# Patient Record
Sex: Male | Born: 1990 | Race: Black or African American | Hispanic: No | Marital: Single | Smoking: Current every day smoker
Health system: Southern US, Community
[De-identification: ages and names within clinical notes are randomized; demographics above are authoritative.]

---
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
## 2010-01-14 ENCOUNTER — Emergency Department (HOSPITAL_COMMUNITY): Admission: EM | Admit: 2010-01-14 | Discharge: 2010-01-14 | Payer: Self-pay | Admitting: Emergency Medicine

## 2011-01-29 ENCOUNTER — Emergency Department (HOSPITAL_COMMUNITY)
Admission: EM | Admit: 2011-01-29 | Discharge: 2011-01-29 | Payer: PRIVATE HEALTH INSURANCE | Attending: Emergency Medicine | Admitting: Emergency Medicine

## 2011-01-29 ENCOUNTER — Encounter: Payer: Self-pay | Admitting: *Deleted

## 2011-01-29 ENCOUNTER — Emergency Department (HOSPITAL_COMMUNITY): Payer: PRIVATE HEALTH INSURANCE

## 2011-01-29 DIAGNOSIS — S0180XA Unspecified open wound of other part of head, initial encounter: Secondary | ICD-10-CM | POA: Insufficient documentation

## 2011-01-29 DIAGNOSIS — IMO0002 Reserved for concepts with insufficient information to code with codable children: Secondary | ICD-10-CM | POA: Insufficient documentation

## 2011-01-29 DIAGNOSIS — S0181XA Laceration without foreign body of other part of head, initial encounter: Secondary | ICD-10-CM

## 2011-01-29 MED ORDER — TETANUS-DIPHTH-ACELL PERTUSSIS 5-2.5-18.5 LF-MCG/0.5 IM SUSP
0.5000 mL | Freq: Once | INTRAMUSCULAR | Status: AC
  Administered 2011-01-29: 0.5 mL via INTRAMUSCULAR
  Filled 2011-01-29: qty 0.5

## 2011-01-29 MED ORDER — IBUPROFEN 800 MG PO TABS
800.0000 mg | ORAL_TABLET | Freq: Once | ORAL | Status: AC
  Administered 2011-01-29: 800 mg via ORAL
  Filled 2011-01-29: qty 1

## 2011-01-29 NOTE — ED Provider Notes (Signed)
Medical screening examination/treatment/procedure(s) were performed by non-physician practitioner and as supervising physician I was immediately available for consultation/collaboration.  Brendon Christoffel, MD 01/29/11 1949 
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
## 2011-01-31 NOTE — ED Provider Notes (Signed)
Medical screening examination/treatment/procedure(s) were performed by non-physician practitioner and as supervising physician I was immediately available for consultation/collaboration.  Donnetta Hutching, MD 01/31/11 [phone]

## 2011-02-06 ENCOUNTER — Encounter (HOSPITAL_COMMUNITY): Payer: Self-pay | Admitting: *Deleted

## 2011-02-06 ENCOUNTER — Emergency Department (HOSPITAL_COMMUNITY)
Admission: EM | Admit: 2011-02-06 | Discharge: 2011-02-06 | Disposition: A | Discharge status: Home / Self Care | Payer: PRIVATE HEALTH INSURANCE | Attending: Emergency Medicine | Admitting: Emergency Medicine

## 2011-02-06 DIAGNOSIS — Z4802 Encounter for removal of sutures: Secondary | ICD-10-CM | POA: Insufficient documentation

## 2011-02-06 NOTE — ED Provider Notes (Signed)
Medical screening examination/treatment/procedure(s) were performed by non-physician practitioner and as supervising physician I was immediately available for consultation/collaboration.   Shelda Jakes, MD 02/06/11 [phone]

## 2011-02-06 NOTE — ED Notes (Signed)
Sutures removed without difficulty after R. Miller  PA-C examined sutured site. Site remains free  Of bleeding, drainage at this time. Pt tolerated well.
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
## 2011-02-06 NOTE — ED Notes (Signed)
Pt here secondary to suture removal need.  Laceration to left forehead is noted to be clean with edges well approximated.  No s/s of infection or drainage noted.  Pt denies fever or pain in that area.

## 2013-10-18 ENCOUNTER — Encounter (HOSPITAL_COMMUNITY): Payer: Self-pay | Admitting: Emergency Medicine

## 2013-10-18 ENCOUNTER — Emergency Department (HOSPITAL_COMMUNITY)
Admission: EM | Admit: 2013-10-18 | Discharge: 2013-10-18 | Disposition: A | Discharge status: Home / Self Care | Payer: BC Managed Care – PPO | Attending: Emergency Medicine | Admitting: Emergency Medicine

## 2013-10-18 ENCOUNTER — Emergency Department (HOSPITAL_COMMUNITY): Payer: BC Managed Care – PPO

## 2013-10-18 DIAGNOSIS — R0602 Shortness of breath: Secondary | ICD-10-CM | POA: Insufficient documentation

## 2013-10-18 DIAGNOSIS — S298XXA Other specified injuries of thorax, initial encounter: Secondary | ICD-10-CM | POA: Insufficient documentation

## 2013-10-18 DIAGNOSIS — T148XXA Other injury of unspecified body region, initial encounter: Secondary | ICD-10-CM

## 2013-10-18 DIAGNOSIS — Y9301 Activity, walking, marching and hiking: Secondary | ICD-10-CM | POA: Insufficient documentation

## 2013-10-18 DIAGNOSIS — F17200 Nicotine dependence, unspecified, uncomplicated: Secondary | ICD-10-CM | POA: Insufficient documentation

## 2013-10-18 DIAGNOSIS — Y92410 Unspecified street and highway as the place of occurrence of the external cause: Secondary | ICD-10-CM | POA: Insufficient documentation

## 2013-10-18 LAB — CBC WITH DIFFERENTIAL/PLATELET
BASOS ABS: 0 10*3/uL (ref 0.0–0.1)
BASOS PCT: 0 % (ref 0–1)
EOS PCT: 2 % (ref 0–5)
Eosinophils Absolute: 0.1 10*3/uL (ref 0.0–0.7)
HCT: 43 % (ref 39.0–52.0)
Hemoglobin: 15.3 g/dL (ref 13.0–17.0)
LYMPHS PCT: 29 % (ref 12–46)
Lymphs Abs: 2 10*3/uL (ref 0.7–4.0)
MCH: 30.5 pg (ref 26.0–34.0)
MCHC: 35.6 g/dL (ref 30.0–36.0)
MCV: 85.8 fL (ref 78.0–100.0)
Monocytes Absolute: 0.4 10*3/uL (ref 0.1–1.0)
Monocytes Relative: 7 % (ref 3–12)
NEUTROS ABS: 4.2 10*3/uL (ref 1.7–7.7)
Neutrophils Relative %: 62 % (ref 43–77)
PLATELETS: 208 10*3/uL (ref 150–400)
RBC: 5.01 MIL/uL (ref 4.22–5.81)
RDW: 13.3 % (ref 11.5–15.5)
WBC: 6.8 10*3/uL (ref 4.0–10.5)

## 2013-10-18 LAB — COMPREHENSIVE METABOLIC PANEL
ALBUMIN: 4.3 g/dL (ref 3.5–5.2)
ALT: 18 U/L (ref 0–53)
AST: 20 U/L (ref 0–37)
Alkaline Phosphatase: 65 U/L (ref 39–117)
BUN: 12 mg/dL (ref 6–23)
CALCIUM: 9.7 mg/dL (ref 8.4–10.5)
CO2: 23 meq/L (ref 19–32)
Chloride: 102 mEq/L (ref 96–112)
Creatinine, Ser: 1.04 mg/dL (ref 0.50–1.35)
GFR calc Af Amer: >90 mL/min (ref >90)
Glucose, Bld: 101 mg/dL — ABNORMAL HIGH (ref 70–99)
Potassium: 3.9 mEq/L (ref 3.7–5.3)
SODIUM: 138 meq/L (ref 137–147)
Total Bilirubin: 0.5 mg/dL (ref 0.3–1.2)
Total Protein: 7.4 g/dL (ref 6.0–8.3)

## 2013-10-18 MED ORDER — TRAMADOL HCL 50 MG PO TABS: 50.0000 mg | ORAL_TABLET | Freq: Four times a day (QID) | ORAL | Status: DC | PRN

## 2013-10-18 MED ORDER — SODIUM CHLORIDE 0.9 % IV SOLN: INTRAVENOUS | Status: DC

## 2013-10-18 MED ORDER — IOHEXOL 300 MG/ML  SOLN
100.0000 mL | Freq: Once | INTRAMUSCULAR | Status: AC | PRN
  Administered 2013-10-18: 100 mL via INTRAVENOUS
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
## 2022-03-30 ENCOUNTER — Ambulatory Visit (HOSPITAL_COMMUNITY)
Admission: EM | Admit: 2022-03-30 | Discharge: 2022-03-30 | Disposition: A | Discharge status: Home / Self Care | Payer: No Payment, Other | Attending: Psychiatry | Admitting: Psychiatry

## 2022-03-30 DIAGNOSIS — F1290 Cannabis use, unspecified, uncomplicated: Secondary | ICD-10-CM | POA: Insufficient documentation

## 2022-03-30 DIAGNOSIS — F1410 Cocaine abuse, uncomplicated: Secondary | ICD-10-CM | POA: Insufficient documentation

## 2022-03-30 NOTE — Social Work (Signed)
Hey patient is ready, patient denies HI SI Patients admits to cocaine use in the last 24 hours. Patient states he is just here for probation. Patient also states that he is going to [place]. patient denies any mental health issues.

## 2022-03-30 NOTE — ED Provider Notes (Cosign Needed Addendum)
Behavioral Health Urgent Care Medical Screening Exam  Patient Name: Miguel Lucas MRN: [id number] Date of Evaluation: 03/30/22 Chief Complaint:   Diagnosis:  Final diagnoses:  Cocaine use disorder ([place])  Marijuana use    History of Present illness: Miguel Lucas is a 31 y.o. male. Patient presents voluntarily to [facility] behavioral health for walk-in assessment.  Patient is accompanied by his girlfriend, who does not remain present during assessment.  Patient is assessed, face-to-face, by nurse practitioner, seated in assessment area, no acute distress.  He is alert and oriented, cooperative during assessment.   Patient  presents with euthymic mood, congruent affect. He  denies suicidal and homicidal ideations. Denies history of suicide attempts, denies history of self-harm.  Patient easily  contracts verbally for safety with this Probation officer.  Patient states "I am here because I have to do this to finish my parole, I have not passed enough drug tests." Patient states "my probation officer dropped me off here, he has arranged for me to complete a 28 day program at [facility] in [facility]." He is committed to his sobriety and recovery today, agrees with plan to seek residential substance use treatment.   Miguel Lucas states "I am not addicted, I just use drugs, I use drugs because I like to." He endorses daily use of  cocaine use and marijuana. Most recent marijuana use 2 days ago, most recent cocaine use yesterday. He denies alcohol use, denies substance use aside from cocaine and marijuana. He has not sought substance use treatment in the past.    Patient has normal speech and behavior.  He  denies auditory and visual hallucinations.  Patient is able to converse coherently with goal-directed thoughts and no distractibility or preoccupation.  Denies symptoms of paranoia.  Objectively there is no evidence of psychosis/mania or delusional thinking.  Patient is not linked with outpatient  psychiatry. No current medications. He denies history of inpatient psychiatric admission. Denies family mental health history, denies family history of addiction.   Miguel Lucas resides in [place] with his girlfriend. He is primary caregiver for his daughter. He denies access to weapons. He is employed.  Patient endorses average sleep and appetite.  Patient offered support and encouragement. Patient offered Facility Based Crisis unit admission to await residential substance use treatment, declined. Prefers to return home and continue to work while awaiting residential substance use treatment availability.     Psychiatric Specialty Exam  Presentation  General Appearance:Appropriate for Environment; Casual  Eye Contact:Good  Speech:Clear and Coherent; Normal Rate  Speech Volume:Normal  Handedness:Right   Mood and Affect  Mood: Euthymic  Affect: Appropriate; Congruent   Thought Process  Thought Processes: Coherent; Goal Directed; Linear  Descriptions of Associations:No data recorded Orientation:Full (Time, Place and Person)  Thought Content:Logical; WDL    Hallucinations:None  Ideas of Reference:None  Suicidal Thoughts:No  Homicidal Thoughts:No   Sensorium  Memory: Immediate Good; Recent Good  Judgment: Fair  Insight: Fair   Community education officer  Concentration: Good  Attention Span: Good  Recall: Good  Fund of Knowledge: Good  Language: Good   Psychomotor Activity  Psychomotor Activity: Normal   Assets  Assets: Communication Skills; Housing; Intimacy; Leisure Time; Resilience; Physical Health; Social Support   Sleep  Sleep: Good  Number of hours: No data recorded  No data recorded  Physical Exam: Physical Exam Vitals and nursing note reviewed.  Constitutional:      Appearance: Normal appearance. He is well-developed and normal weight.  HENT:  Head: Normocephalic and atraumatic.  Cardiovascular:     Rate and Rhythm:  Normal rate.  Pulmonary:     Effort: Pulmonary effort is normal.  Musculoskeletal:        General: Normal range of motion.  Skin:    General: Skin is warm and dry.  Neurological:     Mental Status: He is alert and oriented to person, place, and time.  Psychiatric:        Attention and Perception: Attention and perception normal.        Mood and Affect: Mood and affect normal.        Speech: Speech normal.        Behavior: Behavior normal. Behavior is cooperative.        Thought Content: Thought content normal.        Cognition and Memory: Cognition and memory normal.        Judgment: Judgment normal.    Review of Systems  Constitutional: Negative.   HENT: Negative.    Eyes: Negative.   Respiratory: Negative.    Cardiovascular: Negative.   Gastrointestinal: Negative.   Genitourinary: Negative.   Musculoskeletal: Negative.   Skin: Negative.   Neurological: Negative.   Psychiatric/Behavioral:  Positive for substance abuse.    Blood pressure (!) 163/90, pulse 80, temperature 98.3 F (36.8 C), temperature source Oral, resp. rate 18, SpO2 98 %. There is no height or weight on file to calculate BMI.  Musculoskeletal: Strength & Muscle Tone: within normal limits Gait & Station: normal Patient leans: N/A   [facility] MSE Discharge Disposition for Follow up and Recommendations: Based on my evaluation the patient does not appear to have an emergency medical condition and can be discharged with resources and follow up care in outpatient services for Medication Management and Individual Therapy Follow up with outpatient mental health, resources provided. Follow up with substance use treatment resources provided.    Lenard Lance, FNP 03/30/2022, 10:04 AM

## 2022-03-30 NOTE — Discharge Instructions (Addendum)
SUBSTANCE USE TREATMENT for Medicaid and State Funded/IPRS  [facility] [address], [place], [address] [phone] phone NOTE: [facility] is no longer offering IOP services.  Serves those who are low-income or have no insurance.  [facility] [address], [place], [place], [address] [phone] phone [phone] fax NOTE: Does have Substance Abuse-Intensive Outpatient Program [facility]) as well as transitional housing if eligible.  [facility] [address] [place], [place], [address] [phone] phone [phone] fax  [facility] [phone] [address] [place], [place], [address] [phone] phone [phone] fax  Patient is instructed prior to discharge to:  Take all medications as prescribed by his/her mental healthcare provider. Report any adverse effects and or reactions from the medicines to his/her outpatient provider promptly. Keep all scheduled appointments, to ensure that you are getting refills on time and to avoid any interruption in your medication.  If you are unable to keep an appointment call to reschedule.  Be sure to follow-up with resources and follow-up appointments provided.  Patient has been instructed & cautioned: To not engage in alcohol and or illegal drug use while on prescription medicines. In the event of worsening symptoms, patient is instructed to call the crisis hotline, 911 and or go to the nearest ED for appropriate evaluation and treatment of symptoms. To follow-up with his/her primary care provider for your other medical issues, concerns and or health care needs.  Information: -National Suicide Prevention Lifeline 1-800-SUICIDE or 4785394982.  -988 offers 24/7 access to trained crisis counselors who can help people experiencing mental health-related distress. People can call or text 988 or chat 988lifeline.org for themselves or if they are worried about a loved one who may need crisis support.

## 2022-03-30 NOTE — BH Assessment (Signed)
LCSW Progress Note   Per Doran Heater, NP, this pt does not require psychiatric hospitalization at this time.  Pt is psychiatrically cleared.  Discharge instructions include several resources for substance use treatment for those without insurance.  EDP Doran Heater, NP, has been notified.  Hansel Starling, MSW, LCSW [facility] [phone] or [phone]

## 2022-09-17 ENCOUNTER — Encounter (HOSPITAL_COMMUNITY): Payer: Self-pay

## 2022-09-17 ENCOUNTER — Other Ambulatory Visit: Payer: Self-pay

## 2022-09-17 ENCOUNTER — Emergency Department (HOSPITAL_COMMUNITY)
Admission: EM | Admit: 2022-09-17 | Discharge: 2022-09-17 | Disposition: A | Discharge status: Home / Self Care | Payer: Medicaid Other | Attending: Emergency Medicine | Admitting: Emergency Medicine

## 2022-09-17 DIAGNOSIS — N369 Urethral disorder, unspecified: Secondary | ICD-10-CM | POA: Diagnosis present

## 2022-09-17 DIAGNOSIS — Z113 Encounter for screening for infections with a predominantly sexual mode of transmission: Secondary | ICD-10-CM | POA: Diagnosis not present

## 2022-09-17 LAB — URINALYSIS, ROUTINE W REFLEX MICROSCOPIC
Bilirubin Urine: NEGATIVE
Glucose, UA: NEGATIVE mg/dL
Hgb urine dipstick: NEGATIVE
Ketones, ur: NEGATIVE mg/dL
Leukocytes,Ua: NEGATIVE
Nitrite: NEGATIVE
Protein, ur: NEGATIVE mg/dL
Specific Gravity, Urine: 1.002 — ABNORMAL LOW (ref 1.005–1.030)
pH: 7 (ref 5.0–8.0)

## 2022-09-17 NOTE — ED Triage Notes (Signed)
Pt was first seen on last month. Was given antibiotics. Has completed the course of Abx, but still has issues with itching and minor pain.

## 2022-09-17 NOTE — ED Notes (Signed)
Pt provided discharge instructions and prescription information. Pt was given the opportunity to ask questions and questions were answered.   

## 2022-09-17 NOTE — Discharge Instructions (Signed)
You are seen today for screening for STI.  Follow-up your results on MyChart since you declined the empiric treatment.  Follow-up with the health department for complete STI screening, the results are positive make sure you either come back here or ordered health department for treatment.

## 2022-09-17 NOTE — ED Provider Notes (Signed)
[place] EMERGENCY DEPARTMENT AT [facility] Provider Note   CSN: [id number] Arrival date & time: 09/17/22  1132     History  No chief complaint on file.   Miguel Lucas is a 32 y.o. male.  He denies any chronic PMH.  Presents to the ER for evaluation today of a tingling feeling of his urethra.  States it is there whether or not he is urinating.  He was treated last month at health department for gonorrhea because of its positive test.  He reports that he completed his entire treatment, his partner was fully treated and they waited till treatment was complete to resume sexual activity.  He denies any penile discharge, denies any genital rash or lesions.  Denies any fevers or chills or other complaints.  He states he just wants to make sure that the gonorrhea has been cured  HPI     Home Medications Prior to Admission medications   Medication Sig Start Date End Date Taking? Authorizing Provider  traMADol (ULTRAM) 50 MG tablet Take 1 tablet (50 mg total) by mouth every 6 (six) hours as needed. 10/18/13   Gilda Crease, MD      Allergies    Patient has no known allergies.    Review of Systems   Review of Systems  Physical Exam Updated Vital Signs BP (!) 162/91 (BP Location: Right Arm)   Pulse 71   Temp 98.6 F (37 C)   Resp 17   Ht 5\' 6"  (1.676 m)   Wt 79.4 kg   SpO2 100%   BMI 28.25 kg/m  Physical Exam Vitals and nursing note reviewed. Exam conducted with a chaperone present.  Constitutional:      General: He is not in acute distress.    Appearance: He is well-developed.  HENT:     Head: Normocephalic and atraumatic.  Eyes:     Conjunctiva/sclera: Conjunctivae normal.  Cardiovascular:     Rate and Rhythm: Normal rate and regular rhythm.     Heart sounds: No murmur heard. Pulmonary:     Effort: Pulmonary effort is normal. No respiratory distress.     Breath sounds: Normal breath sounds.  Abdominal:     Palpations: Abdomen is soft.      Tenderness: There is no abdominal tenderness.  Genitourinary:    Penis: Normal.   Musculoskeletal:        General: No swelling.     Cervical back: Neck supple.  Skin:    General: Skin is warm and dry.     Capillary Refill: Capillary refill takes less than 2 seconds.  Neurological:     General: No focal deficit present.     Mental Status: He is alert and oriented to person, place, and time.  Psychiatric:        Mood and Affect: Mood normal.     ED Results / Procedures / Treatments   Labs (all labs ordered are listed, but only abnormal results are displayed) Labs Reviewed  URINALYSIS, ROUTINE W REFLEX MICROSCOPIC  GC/CHLAMYDIA PROBE AMP ([place]) NOT AT [facility]    EKG None  Radiology No results found.  Procedures Procedures    Medications Ordered in ED Medications - No data to display  ED Course/ Medical Decision Making/ A&P                             Medical Decision Making DDx: Gonorrhea, chlamydia, HSV, UTI, other\ Course:  Patient presented following repeat STI testing to ensure that he has had care, as well as anything as a tingling sensation of the urethra, no dysuria frequency urgency.  No fevers or chills.  He is requesting by urine be sent and he be discharged as he needs to leave for work.  Does not want empiric treatment, discussed that he will need to follow-up with his doctor on MyChart and he states he is able to do this.           Final Clinical Impression(s) / ED Diagnoses Final diagnoses:  Encounter for screening examination for sexually transmitted infection    Rx / DC Orders ED Discharge Orders     None         Ma Rings, PA-C 09/17/22 1243    Cathren Laine, MD 09/18/22 [phone]

## 2022-09-19 LAB — GC/CHLAMYDIA PROBE AMP (~~LOC~~) NOT AT ARMC
Chlamydia: NEGATIVE
Comment: NEGATIVE
Comment: NORMAL
Neisseria Gonorrhea: POSITIVE — AB

## 2022-11-04 ENCOUNTER — Other Ambulatory Visit: Payer: Self-pay

## 2022-11-04 ENCOUNTER — Encounter (HOSPITAL_COMMUNITY)
Admission: EM | Disposition: A | Discharge status: Home / Self Care | Payer: Self-pay | Source: Home / Self Care | Attending: Cardiology

## 2022-11-04 ENCOUNTER — Encounter (HOSPITAL_COMMUNITY): Payer: Self-pay

## 2022-11-04 ENCOUNTER — Other Ambulatory Visit (HOSPITAL_COMMUNITY): Payer: Self-pay

## 2022-11-04 ENCOUNTER — Inpatient Hospital Stay (HOSPITAL_COMMUNITY)
Admission: EM | Admit: 2022-11-04 | Discharge: 2022-11-05 | DRG: 251 | Disposition: A | Discharge status: Home / Self Care | Payer: Medicaid Other | Attending: Cardiology | Admitting: Cardiology

## 2022-11-04 ENCOUNTER — Inpatient Hospital Stay (HOSPITAL_COMMUNITY): Payer: Medicaid Other

## 2022-11-04 ENCOUNTER — Emergency Department (HOSPITAL_COMMUNITY): Payer: Medicaid Other

## 2022-11-04 DIAGNOSIS — Z8249 Family history of ischemic heart disease and other diseases of the circulatory system: Secondary | ICD-10-CM

## 2022-11-04 DIAGNOSIS — F1410 Cocaine abuse, uncomplicated: Secondary | ICD-10-CM | POA: Diagnosis present

## 2022-11-04 DIAGNOSIS — I2121 ST elevation (STEMI) myocardial infarction involving left circumflex coronary artery: Secondary | ICD-10-CM | POA: Diagnosis not present

## 2022-11-04 DIAGNOSIS — I2510 Atherosclerotic heart disease of native coronary artery without angina pectoris: Secondary | ICD-10-CM | POA: Diagnosis present

## 2022-11-04 DIAGNOSIS — I10 Essential (primary) hypertension: Secondary | ICD-10-CM | POA: Diagnosis present

## 2022-11-04 DIAGNOSIS — Z79899 Other long term (current) drug therapy: Secondary | ICD-10-CM

## 2022-11-04 DIAGNOSIS — I213 ST elevation (STEMI) myocardial infarction of unspecified site: Principal | ICD-10-CM

## 2022-11-04 DIAGNOSIS — F17210 Nicotine dependence, cigarettes, uncomplicated: Secondary | ICD-10-CM | POA: Diagnosis present

## 2022-11-04 DIAGNOSIS — R079 Chest pain, unspecified: Secondary | ICD-10-CM | POA: Diagnosis present

## 2022-11-04 HISTORY — PX: CORONARY/GRAFT ACUTE MI REVASCULARIZATION: CATH118305

## 2022-11-04 LAB — I-STAT CHEM 8, ED
BUN: 7 mg/dL (ref 6–20)
Calcium, Ion: 1.21 mmol/L (ref 1.15–1.40)
Chloride: 100 mmol/L (ref 98–111)
Creatinine, Ser: 1.1 mg/dL (ref 0.61–1.24)
Glucose, Bld: 165 mg/dL — ABNORMAL HIGH (ref 70–99)
HCT: 52 % (ref 39.0–52.0)
Hemoglobin: 17.7 g/dL — ABNORMAL HIGH (ref 13.0–17.0)
Potassium: 4 mmol/L (ref 3.5–5.1)
Sodium: 139 mmol/L (ref 135–145)
TCO2: 27 mmol/L (ref 22–32)

## 2022-11-04 LAB — COMPREHENSIVE METABOLIC PANEL
ALT: 14 U/L (ref 0–44)
AST: 18 U/L (ref 15–41)
Albumin: 4.1 g/dL (ref 3.5–5.0)
Alkaline Phosphatase: 62 U/L (ref 38–126)
Anion gap: 15 (ref 5–15)
BUN: 8 mg/dL (ref 6–20)
CO2: 23 mmol/L (ref 22–32)
Calcium: 9.5 mg/dL (ref 8.9–10.3)
Chloride: 99 mmol/L (ref 98–111)
Creatinine, Ser: 1.14 mg/dL (ref 0.61–1.24)
GFR, Estimated: >60 mL/min (ref >60)
Glucose, Bld: 176 mg/dL — ABNORMAL HIGH (ref 70–99)
Potassium: 4.1 mmol/L (ref 3.5–5.1)
Sodium: 137 mmol/L (ref 135–145)
Total Bilirubin: 0.4 mg/dL (ref 0.3–1.2)
Total Protein: 7.2 g/dL (ref 6.5–8.1)

## 2022-11-04 LAB — CBC WITH DIFFERENTIAL/PLATELET
Abs Immature Granulocytes: 0.04 10*3/uL (ref 0.00–0.07)
Basophils Absolute: 0.1 10*3/uL (ref 0.0–0.1)
Basophils Relative: 1 %
Eosinophils Absolute: 0.3 10*3/uL (ref 0.0–0.5)
Eosinophils Relative: 3 %
HCT: 49.2 % (ref 39.0–52.0)
Hemoglobin: 16.6 g/dL (ref 13.0–17.0)
Immature Granulocytes: 0 %
Lymphocytes Relative: 34 %
Lymphs Abs: 3.5 10*3/uL (ref 0.7–4.0)
MCH: 30 pg (ref 26.0–34.0)
MCHC: 33.7 g/dL (ref 30.0–36.0)
MCV: 88.8 fL (ref 80.0–100.0)
Monocytes Absolute: 0.5 10*3/uL (ref 0.1–1.0)
Monocytes Relative: 5 %
Neutro Abs: 5.9 10*3/uL (ref 1.7–7.7)
Neutrophils Relative %: 57 %
Platelets: 267 10*3/uL (ref 150–400)
RBC: 5.54 MIL/uL (ref 4.22–5.81)
RDW: 13.7 % (ref 11.5–15.5)
WBC: 10.4 10*3/uL (ref 4.0–10.5)
nRBC: 0 % (ref 0.0–0.2)

## 2022-11-04 LAB — LIPID PANEL
Cholesterol: 154 mg/dL (ref 0–200)
HDL: 37 mg/dL — ABNORMAL LOW (ref >40)
LDL Cholesterol: 102 mg/dL — ABNORMAL HIGH (ref 0–99)
Total CHOL/HDL Ratio: 4.2 RATIO
Triglycerides: 73 mg/dL (ref <150)
VLDL: 15 mg/dL (ref 0–40)

## 2022-11-04 LAB — HEMOGLOBIN A1C
Hgb A1c MFr Bld: 4.9 % (ref 4.8–5.6)
Mean Plasma Glucose: 93.93 mg/dL

## 2022-11-04 LAB — TROPONIN I (HIGH SENSITIVITY)
Troponin I (High Sensitivity): 11 ng/L (ref <18)
Troponin I (High Sensitivity): >24000 ng/L (ref <18)

## 2022-11-04 LAB — APTT: aPTT: 24 seconds (ref 24–36)

## 2022-11-04 LAB — MRSA NEXT GEN BY PCR, NASAL: MRSA by PCR Next Gen: NOT DETECTED

## 2022-11-04 LAB — POCT ACTIVATED CLOTTING TIME: Activated Clotting Time: 489 seconds

## 2022-11-04 LAB — GLUCOSE, CAPILLARY: Glucose-Capillary: 113 mg/dL — ABNORMAL HIGH (ref 70–99)

## 2022-11-04 LAB — PROTIME-INR
INR: 0.9 (ref 0.8–1.2)
Prothrombin Time: 12.2 seconds (ref 11.4–15.2)

## 2022-11-04 SURGERY — CORONARY/GRAFT ACUTE MI REVASCULARIZATION
Anesthesia: LOCAL

## 2022-11-04 MED ORDER — FENTANYL CITRATE (PF) 100 MCG/2ML IJ SOLN
INTRAMUSCULAR | Status: AC
  Filled 2022-11-04: qty 2

## 2022-11-04 MED ORDER — TICAGRELOR 90 MG PO TABS
ORAL_TABLET | ORAL | Status: DC | PRN
  Administered 2022-11-04: 180 mg via ORAL

## 2022-11-04 MED ORDER — ACETAMINOPHEN 325 MG PO TABS: 650.0000 mg | ORAL_TABLET | ORAL | Status: DC | PRN

## 2022-11-04 MED ORDER — LABETALOL HCL 5 MG/ML IV SOLN: 10.0000 mg | INTRAVENOUS | Status: AC | PRN

## 2022-11-04 MED ORDER — HYDRALAZINE HCL 20 MG/ML IJ SOLN: 10.0000 mg | INTRAMUSCULAR | Status: AC | PRN

## 2022-11-04 MED ORDER — CLOPIDOGREL BISULFATE 75 MG PO TABS
75.0000 mg | ORAL_TABLET | Freq: Every day | ORAL | Status: DC
  Administered 2022-11-05: 75 mg via ORAL
  Filled 2022-11-04: qty 1

## 2022-11-04 MED ORDER — SODIUM CHLORIDE 0.9% FLUSH
3.0000 mL | Freq: Two times a day (BID) | INTRAVENOUS | Status: DC
  Administered 2022-11-04 (×2): 3 mL via INTRAVENOUS

## 2022-11-04 MED ORDER — NITROGLYCERIN 1 MG/10 ML FOR IR/CATH LAB
INTRA_ARTERIAL | Status: AC
  Filled 2022-11-04: qty 10

## 2022-11-04 MED ORDER — MIDAZOLAM HCL 2 MG/2ML IJ SOLN
INTRAMUSCULAR | Status: AC
  Filled 2022-11-04: qty 2

## 2022-11-04 MED ORDER — TICAGRELOR 90 MG PO TABS: 90.0000 mg | ORAL_TABLET | Freq: Two times a day (BID) | ORAL | Status: DC

## 2022-11-04 MED ORDER — LIDOCAINE HCL (PF) 1 % IJ SOLN
INTRAMUSCULAR | Status: DC | PRN
  Administered 2022-11-04: 2 mL via INTRADERMAL

## 2022-11-04 MED ORDER — NITROGLYCERIN 1 MG/10 ML FOR IR/CATH LAB
INTRA_ARTERIAL | Status: DC | PRN
  Administered 2022-11-04 (×3): 200 ug via INTRACORONARY

## 2022-11-04 MED ORDER — SODIUM CHLORIDE 0.9 % IV SOLN: INTRAVENOUS | Status: DC

## 2022-11-04 MED ORDER — SODIUM CHLORIDE 0.9 % IV SOLN: 250.0000 mL | INTRAVENOUS | Status: DC | PRN

## 2022-11-04 MED ORDER — VERAPAMIL HCL 2.5 MG/ML IV SOLN
INTRAVENOUS | Status: AC
  Filled 2022-11-04: qty 2

## 2022-11-04 MED ORDER — HEPARIN (PORCINE) IN NACL 1000-0.9 UT/500ML-% IV SOLN
INTRAVENOUS | Status: DC | PRN
  Administered 2022-11-04 (×2): 500 mL

## 2022-11-04 MED ORDER — TIROFIBAN HCL IN NACL 5-0.9 MG/100ML-% IV SOLN
INTRAVENOUS | Status: AC
  Filled 2022-11-04: qty 100

## 2022-11-04 MED ORDER — AMLODIPINE BESYLATE 5 MG PO TABS
5.0000 mg | ORAL_TABLET | Freq: Every day | ORAL | 1 refills | Status: DC
  Filled 2022-11-04: qty 30, 30d supply, fill #0

## 2022-11-04 MED ORDER — FENTANYL CITRATE (PF) 100 MCG/2ML IJ SOLN
INTRAMUSCULAR | Status: DC | PRN
  Administered 2022-11-04 (×2): 50 ug via INTRAVENOUS

## 2022-11-04 MED ORDER — ISOSORBIDE MONONITRATE ER 30 MG PO TB24
60.0000 mg | ORAL_TABLET | Freq: Every day | ORAL | Status: DC
  Administered 2022-11-04 – 2022-11-05 (×2): 60 mg via ORAL
  Filled 2022-11-04 (×2): qty 2

## 2022-11-04 MED ORDER — NITROGLYCERIN IN D5W 200-5 MCG/ML-% IV SOLN
INTRAVENOUS | Status: AC | PRN
  Administered 2022-11-04: 20 ug/min via INTRAVENOUS

## 2022-11-04 MED ORDER — TIROFIBAN HCL IN NACL 5-0.9 MG/100ML-% IV SOLN
INTRAVENOUS | Status: DC | PRN
  Administered 2022-11-04: .15 ug/kg/min via INTRAVENOUS

## 2022-11-04 MED ORDER — NITROGLYCERIN IN D5W 200-5 MCG/ML-% IV SOLN
0.0000 ug/min | INTRAVENOUS | Status: DC
  Administered 2022-11-04: 30 ug/min via INTRAVENOUS

## 2022-11-04 MED ORDER — ASPIRIN 81 MG PO TBEC
81.0000 mg | DELAYED_RELEASE_TABLET | Freq: Every day | ORAL | Status: DC
  Administered 2022-11-05: 81 mg via ORAL
  Filled 2022-11-04: qty 1

## 2022-11-04 MED ORDER — SODIUM CHLORIDE 0.9% FLUSH: 3.0000 mL | INTRAVENOUS | Status: DC | PRN

## 2022-11-04 MED ORDER — ASPIRIN 81 MG PO CHEW
324.0000 mg | CHEWABLE_TABLET | Freq: Once | ORAL | Status: AC
  Administered 2022-11-04: 324 mg via ORAL
  Filled 2022-11-04: qty 4

## 2022-11-04 MED ORDER — MELATONIN 3 MG PO TABS
3.0000 mg | ORAL_TABLET | Freq: Every day | ORAL | Status: DC
  Administered 2022-11-04: 3 mg via ORAL
  Filled 2022-11-04: qty 1

## 2022-11-04 MED ORDER — LISINOPRIL 20 MG PO TABS
20.0000 mg | ORAL_TABLET | Freq: Every evening | ORAL | Status: DC
  Administered 2022-11-04: 20 mg via ORAL
  Filled 2022-11-04: qty 1

## 2022-11-04 MED ORDER — NITROGLYCERIN IN D5W 200-5 MCG/ML-% IV SOLN
INTRAVENOUS | Status: AC
  Filled 2022-11-04: qty 250

## 2022-11-04 MED ORDER — ROSUVASTATIN CALCIUM 20 MG PO TABS
20.0000 mg | ORAL_TABLET | Freq: Every day | ORAL | Status: DC
  Administered 2022-11-04 – 2022-11-05 (×2): 20 mg via ORAL
  Filled 2022-11-04 (×2): qty 1

## 2022-11-04 MED ORDER — SODIUM CHLORIDE 0.9 % IV SOLN
INTRAVENOUS | Status: AC | PRN
  Administered 2022-11-04: 10 mL/h via INTRAVENOUS

## 2022-11-04 MED ORDER — ONDANSETRON HCL 4 MG/2ML IJ SOLN: 4.0000 mg | Freq: Four times a day (QID) | INTRAMUSCULAR | Status: DC | PRN

## 2022-11-04 MED ORDER — MIDAZOLAM HCL 2 MG/2ML IJ SOLN
INTRAMUSCULAR | Status: DC | PRN
  Administered 2022-11-04 (×2): 2 mg via INTRAVENOUS

## 2022-11-04 MED ORDER — TICAGRELOR 90 MG PO TABS
ORAL_TABLET | ORAL | Status: AC
  Filled 2022-11-04: qty 2

## 2022-11-04 MED ORDER — LISINOPRIL 20 MG PO TABS
20.0000 mg | ORAL_TABLET | Freq: Every evening | ORAL | 1 refills | Status: DC
  Filled 2022-11-04: qty 30, 30d supply, fill #0

## 2022-11-04 MED ORDER — HEPARIN SODIUM (PORCINE) 5000 UNIT/ML IJ SOLN
4000.0000 [IU] | Freq: Once | INTRAMUSCULAR | Status: AC
  Administered 2022-11-04: 4000 [IU] via INTRAVENOUS
  Filled 2022-11-04: qty 1

## 2022-11-04 MED ORDER — IOHEXOL 350 MG/ML SOLN
INTRAVENOUS | Status: DC | PRN
  Administered 2022-11-04: 210 mL via INTRA_ARTERIAL

## 2022-11-04 MED ORDER — CLOPIDOGREL BISULFATE 300 MG PO TABS
300.0000 mg | ORAL_TABLET | Freq: Once | ORAL | Status: AC
  Administered 2022-11-04: 300 mg via ORAL
  Filled 2022-11-04: qty 1

## 2022-11-04 MED ORDER — ISOSORBIDE MONONITRATE ER 60 MG PO TB24
60.0000 mg | ORAL_TABLET | Freq: Every day | ORAL | 1 refills | Status: DC
  Filled 2022-11-04: qty 30, 30d supply, fill #0

## 2022-11-04 MED ORDER — CHLORHEXIDINE GLUCONATE CLOTH 2 % EX PADS
6.0000 | MEDICATED_PAD | Freq: Every day | CUTANEOUS | Status: DC
  Administered 2022-11-04 – 2022-11-05 (×2): 6 via TOPICAL

## 2022-11-04 MED ORDER — NITROGLYCERIN 0.4 MG SL SUBL
0.4000 mg | SUBLINGUAL_TABLET | SUBLINGUAL | 1 refills | Status: DC | PRN
  Filled 2022-11-04: qty 25, 5d supply, fill #0

## 2022-11-04 MED ORDER — TIROFIBAN (AGGRASTAT) BOLUS VIA INFUSION
INTRAVENOUS | Status: DC | PRN
  Administered 2022-11-04: 1985 ug via INTRAVENOUS

## 2022-11-04 MED ORDER — LIDOCAINE HCL (PF) 1 % IJ SOLN
INTRAMUSCULAR | Status: AC
  Filled 2022-11-04: qty 30

## 2022-11-04 MED ORDER — SODIUM CHLORIDE 0.9 % IV SOLN: INTRAVENOUS | Status: AC

## 2022-11-04 MED ORDER — CLOPIDOGREL BISULFATE 75 MG PO TABS
75.0000 mg | ORAL_TABLET | Freq: Every day | ORAL | 1 refills | Status: DC
  Filled 2022-11-04: qty 30, 30d supply, fill #0

## 2022-11-04 MED ORDER — ROSUVASTATIN CALCIUM 20 MG PO TABS
20.0000 mg | ORAL_TABLET | Freq: Every day | ORAL | 1 refills | Status: DC
  Filled 2022-11-04: qty 30, 30d supply, fill #0

## 2022-11-04 MED ORDER — HEPARIN SODIUM (PORCINE) 1000 UNIT/ML IJ SOLN
INTRAMUSCULAR | Status: DC | PRN
  Administered 2022-11-04 (×2): 4000 [IU] via INTRAVENOUS

## 2022-11-04 MED ORDER — ASPIRIN 81 MG PO TBEC
81.0000 mg | DELAYED_RELEASE_TABLET | Freq: Every day | ORAL | 12 refills | Status: DC
  Filled 2022-11-04: qty 120, 120d supply, fill #0

## 2022-11-04 MED ORDER — AMLODIPINE BESYLATE 5 MG PO TABS
5.0000 mg | ORAL_TABLET | Freq: Every day | ORAL | Status: DC
  Administered 2022-11-04 – 2022-11-05 (×2): 5 mg via ORAL
  Filled 2022-11-04 (×2): qty 1

## 2022-11-04 SURGICAL SUPPLY — 20 items
BALLN EMERGE MR 2.0X12 (BALLOONS) ×1
BALLN SAPPHIRE 3.0X12 (BALLOONS) ×1
BALLOON EMERGE MR 2.0X12 (BALLOONS) IMPLANT
BALLOON SAPPHIRE 3.0X12 (BALLOONS) IMPLANT
CATH EXTRAC PRONTO 5.5F 138CM (CATHETERS) IMPLANT
CATH LAUNCHER 6FR EBU 3 (CATHETERS) IMPLANT
CATH LAUNCHER 6FR EBU3.5 (CATHETERS) IMPLANT
CATH OPTITORQUE TIG 4.0 5F (CATHETERS) IMPLANT
DEVICE RAD COMP TR BAND LRG (VASCULAR PRODUCTS) IMPLANT
GLIDESHEATH SLEND A-KIT 6F 22G (SHEATH) IMPLANT
GUIDEWIRE INQWIRE 1.5J.035X260 (WIRE) IMPLANT
INQWIRE 1.5J .035X260CM (WIRE) ×1
KIT ENCORE 26 ADVANTAGE (KITS) IMPLANT
KIT HEART LEFT (KITS) ×1 IMPLANT
KIT HEMO VALVE WATCHDOG (MISCELLANEOUS) IMPLANT
PACK CARDIAC CATHETERIZATION (CUSTOM PROCEDURE TRAY) ×1 IMPLANT
TRANSDUCER W/STOPCOCK (MISCELLANEOUS) ×1 IMPLANT
TUBING CIL FLEX 10 FLL-RA (TUBING) ×1 IMPLANT
WIRE ASAHI PROWATER 180CM (WIRE) IMPLANT
WIRE COUGAR XT STRL 190CM (WIRE) IMPLANT

## 2022-11-04 NOTE — Progress Notes (Addendum)
CARDIAC REHAB PHASE I   Began discussing ed with pt and fiance. Discussed MI, PTCA, restrictions, CM, and importance of smoking and cocaine cessation. Pt overwhelmed by the thought of smoking cessation, immediately asking when he can leave hospital. Left materials for pt and fiance. Assisted pt getting comfortable. Will f/u tomorrow for ambulation and more education (not ready currently). Referral in for [place] [facility] however pt uninsured. [phone] Ethelda Chick BS, ACSM-CEP 11/04/2022 1:37 PM

## 2022-11-04 NOTE — ED Notes (Signed)
ED Provider at bedside. 

## 2022-11-04 NOTE — ED Triage Notes (Signed)
Pt arrived via [facility] . From home. 10/10 chest pain woke from sleep. Pt was week and diaphoretic.   Pt uses cocaine daily. No known heart issues. Pt woke up out of sleep hours after using cocaine .   K 2 used recently   18 r bicep  108/72 Hr 48   98% on ra  Cbg 152

## 2022-11-04 NOTE — ED Notes (Signed)
Cardiology at bedside at [phone]

## 2022-11-04 NOTE — H&P (Signed)
Miguel Lucas is an 32 y.o. male.   Chief Complaint: Chest pain HPI:   32 y.o. African American male  with cocaine abuse, with chest pain  Patient has been doing cocaine since age 57 almost everyday, used cocaine a few hour ago. He started having retrosternal chest pain, EMS were called. He was weak and diaphoretic. He is still having 10/10 chest pain. EKG showed inferior ST elevation with anterior ST depression.  History reviewed. No pertinent past medical history.  History reviewed. No pertinent surgical history.  History reviewed. No pertinent family history.  Social History:  reports that he has been smoking cigarettes. He has been smoking an average of 1 pack per day. He has never used smokeless tobacco. He reports current drug use. Frequency: 7.00 times per week. Drugs: Cocaine and "Crack" cocaine. He reports that he does not drink alcohol.  Allergies: No Known Allergies  Review of Systems  Cardiovascular:  Positive for chest pain. Negative for dyspnea on exertion, leg swelling, palpitations and syncope.     Blood pressure (!) 149/109, pulse 66, temperature 98.1 F (36.7 C), temperature source Oral, resp. rate (!) 22, height 6\' 1"  (1.854 m), weight 79.4 kg, SpO2 100 %. Body mass index is 23.09 kg/m.   Physical Exam Vitals and nursing note reviewed.  Constitutional:      General: He is not in acute distress. Neck:     Vascular: No JVD.  Cardiovascular:     Rate and Rhythm: Normal rate and regular rhythm.     Heart sounds: Normal heart sounds. No murmur heard. Pulmonary:     Effort: Pulmonary effort is normal.     Breath sounds: Normal breath sounds. No wheezing or rales.  Musculoskeletal:     Right lower leg: No edema.     Left lower leg: No edema.      (Not in a hospital admission)     Current Facility-Administered Medications:    0.9 %  sodium chloride infusion, , Intravenous, Continuous, Roxy Horseman, PA-C   aspirin chewable tablet 324 mg, 324 mg,  Oral, Once, Roxy Horseman, PA-C   heparin injection 4,000 Units, 4,000 Units, Intravenous, Once, Roxy Horseman, PA-C  Current Outpatient Medications:    traMADol (ULTRAM) 50 MG tablet, Take 1 tablet (50 mg total) by mouth every 6 (six) hours as needed., Disp: 15 tablet, Rfl: 0  Today's Vitals   11/04/22 0400 11/04/22 0444 11/04/22 0445 11/04/22 0448  BP:  (!) 151/108  (!) 149/109  Pulse:  60  66  Resp:  16  (!) 22  Temp:  98.1 F (36.7 C)    TempSrc:  Oral    SpO2:  100%  100%  Weight: 79 kg  79.4 kg   Height:   6\' 1"  (1.854 m)   PainSc:   10-Worst pain ever    Body mass index is 23.09 kg/m.    Lab Results: Reviewed and interpreted: Labs pending    Tests ordered:  Lab Orders         Hemoglobin A1c         CBC with Differential/Platelet         Protime-INR         APTT         Comprehensive metabolic panel         Lipid panel         Rapid urine drug screen (hospital performed)         I-Stat CG4 Lactic Acid, ED  Cardiac Studies:  EKG 11/04/2022: Serial EKGs from EMS and ER reviewed. Sinus rhythm with dynamic inferior ST elevation, anterolateral reciprocal ST depression  Echocardiogram: Ordered   Imaging/tests reviewed and independently interpreted: CXR normal  Assessment & Recommendations:  32 y.o. African American male  with cocaine abuse, with chest pain  Chest pain: STEMI due to cocaine Dynamic EKG changes, 10/10 chest pain. Will proceed with coronary angiography, possible intervention. Risks, benefits,alternate options discussed with patient and fiance at bedside.     Elder Negus, MD Pager: [phone] Office: [phone]

## 2022-11-04 NOTE — ED Notes (Signed)
Pt left ER to cath lab [phone]

## 2022-11-04 NOTE — ED Notes (Signed)
Pt placed on Zoll pads 0441

## 2022-11-04 NOTE — ED Provider Notes (Signed)
[facility]Y DEPT [facility] Emergency Department Provider Note MRN:  [id number]  Arrival date & time: 11/04/22     Chief Complaint   Code STEMI   History of Present Illness   Miguel Lucas is a 32 y.o. year-old male presents to the ED with chief complaint of chest pain.  Activated as a CODE STEMI.  EMS called out for chest pain, patient complaining of severe chest pain, was very diaphoretic, and had some confusion as well.  Patient reports that he is an everyday cocaine user.  Patient and EMS provide history.   Review of Systems  Pertinent positive and negative review of systems noted in HPI.    Physical Exam   Vitals:   11/04/22 0444 11/04/22 0448  BP: (!) 151/108 (!) 149/109  Pulse: 60 66  Resp: 16 (!) 22  Temp: 98.1 F (36.7 C)   SpO2: 100% 100%    CONSTITUTIONAL:  unwell-appearing, NAD NEURO:  Alert and oriented x 3, CN 3-12 grossly intact EYES:  eyes equal and reactive ENT/NECK:  Supple, no stridor  CARDIO:  normal rate, regular rhythm, appears well-perfused  PULM:  No respiratory distress, CTAB GI/GU:  non-distended,  MSK/SPINE:  No gross deformities, no edema, moves all extremities  SKIN:  no rash, atraumatic   *Additional and/or pertinent findings included in MDM below  Diagnostic and Interventional Summary    EKG Interpretation  Date/Time:  Friday November 04 2022 04:40:04 EDT Ventricular Rate:  57 PR Interval:  184 QRS Duration: 95 QT Interval:  404 QTC Calculation: 394 R Axis:   91 Text Interpretation: Sinus rhythm Inferoposterior infarct, acute (LCx) Lateral infarct, acute Anteroseptal infarct, age indeterminate ST depression V1-V3, suggest recording posterior leads >>> Acute MI <<< Confirmed by Marily Memos [phone]) on 11/04/2022 4:58:05 AM       Labs Reviewed  I-STAT CHEM 8, ED - Abnormal; Notable for the following components:      Result Value   Glucose, Bld 165 (*)    Hemoglobin 17.7 (*)    All other components within normal  limits  CBC WITH DIFFERENTIAL/PLATELET  HEMOGLOBIN A1C  PROTIME-INR  APTT  COMPREHENSIVE METABOLIC PANEL  LIPID PANEL  RAPID URINE DRUG SCREEN, HOSP PERFORMED  I-STAT CG4 LACTIC ACID, ED  TROPONIN I (HIGH SENSITIVITY)    DG Chest Port 1 View  Final Result      Medications  0.9 %  sodium chloride infusion ( Intravenous New Bag/Given 11/04/22 0455)  aspirin chewable tablet 324 mg (324 mg Oral Given 11/04/22 0453)  heparin injection 4,000 Units (4,000 Units Intravenous Given 11/04/22 0453)     Procedures  /  Critical Care .Critical Care  Performed by: Roxy Horseman, PA-C Authorized by: Roxy Horseman, PA-C   Critical care provider statement:    Critical care time (minutes):  33   Critical care was necessary to treat or prevent imminent or life-threatening deterioration of the following conditions:  Circulatory failure   Critical care was time spent personally by me on the following activities:  Development of treatment plan with patient or surrogate, discussions with consultants, evaluation of patient's response to treatment, examination of patient, ordering and review of laboratory studies, ordering and review of radiographic studies, ordering and performing treatments and interventions, pulse oximetry, re-evaluation of patient's condition and review of old charts   ED Course and Medical Decision Making  I have reviewed the triage vital signs, the nursing notes, and pertinent available records from the EMR.  Social Determinants Affecting Complexity of Care:  Patient has no clinically significant social determinants affecting this chief complaint..   ED Course:    Medical Decision Making Code STEMI for chest pain.  Daily cocaine use.  Case discussed with STEMI doc, Dr. Rosemary Holms.  Will need admission.  Heparin and aspirin given.  TNK considered, but cardiology at bedside and will take for cath.  Amount and/or Complexity of Data Reviewed Labs: ordered. Radiology:  ordered.  Risk OTC drugs. Prescription drug management.         Consultants: I consulted with Dr. Rosemary Holms, who recommends cath lab.   Treatment and Plan: Patient's exam and diagnostic results are concerning for ACS.  Feel that patient will need admission to the hospital for further treatment and evaluation.    Final Clinical Impressions(s) / ED Diagnoses     ICD-10-CM   1. ST elevation myocardial infarction (STEMI), unspecified artery (HCC)  I21.3     2. Cocaine abuse (HCC)  F14.10       ED Discharge Orders     None         Discharge Instructions Discussed with and Provided to Patient:   Discharge Instructions   None      Roxy Horseman, PA-C 11/04/22 0507    Mesner, Barbara Cower, MD 11/04/22 [phone]

## 2022-11-04 NOTE — Progress Notes (Signed)
I have reviewed his EKG, no change, presently asymptomatic.  D/C IV NTG after he receives Norvasc 5 mg and Imdur 60 mg, start Lisinopril 20 mg in the evening for hypertension, Change Brilinta to Plavix (insurance), load with 300 now and 75 daily, S/L NTG.   Meds sent to [facility] pharmacy. D/C med reconciliation done.   Yates Decamp, MD, [facility] 11/04/2022, 4:22 PM Office: [phone] Fax: [phone] Pager: [phone]

## 2022-11-05 ENCOUNTER — Other Ambulatory Visit (HOSPITAL_COMMUNITY): Payer: Self-pay

## 2022-11-05 DIAGNOSIS — F17210 Nicotine dependence, cigarettes, uncomplicated: Secondary | ICD-10-CM

## 2022-11-05 LAB — CBC
HCT: 47.7 % (ref 39.0–52.0)
Hemoglobin: 16.8 g/dL (ref 13.0–17.0)
MCH: 30.5 pg (ref 26.0–34.0)
MCHC: 35.2 g/dL (ref 30.0–36.0)
MCV: 86.6 fL (ref 80.0–100.0)
Platelets: 272 10*3/uL (ref 150–400)
RBC: 5.51 MIL/uL (ref 4.22–5.81)
RDW: 13.5 % (ref 11.5–15.5)
WBC: 11.2 10*3/uL — ABNORMAL HIGH (ref 4.0–10.5)
nRBC: 0 % (ref 0.0–0.2)

## 2022-11-05 LAB — BASIC METABOLIC PANEL
Anion gap: 9 (ref 5–15)
BUN: <5 mg/dL — ABNORMAL LOW (ref 6–20)
CO2: 23 mmol/L (ref 22–32)
Calcium: 9.4 mg/dL (ref 8.9–10.3)
Chloride: 103 mmol/L (ref 98–111)
Creatinine, Ser: 0.89 mg/dL (ref 0.61–1.24)
GFR, Estimated: >60 mL/min (ref >60)
Glucose, Bld: 110 mg/dL — ABNORMAL HIGH (ref 70–99)
Potassium: 3.6 mmol/L (ref 3.5–5.1)
Sodium: 135 mmol/L (ref 135–145)

## 2022-11-05 LAB — ECHOCARDIOGRAM COMPLETE
AR max vel: 3.33 cm2
AV Area VTI: 3 cm2
AV Area mean vel: 3.07 cm2
AV Mean grad: 2 mmHg
AV Peak grad: 3.2 mmHg
Ao pk vel: 0.89 m/s
Area-P 1/2: 5.02 cm2
Height: 73 in
S' Lateral: 3.2 cm
Weight: 2800 oz

## 2022-11-05 MED ORDER — LISINOPRIL 20 MG PO TABS
20.0000 mg | ORAL_TABLET | Freq: Every morning | ORAL | 1 refills | Status: DC
  Filled 2022-11-05: qty 30, 30d supply, fill #0

## 2022-11-05 MED ORDER — DILTIAZEM HCL ER COATED BEADS 120 MG PO CP24
120.0000 mg | ORAL_CAPSULE | Freq: Every morning | ORAL | 3 refills | Status: DC
  Filled 2022-11-05 – 2022-11-07 (×2): qty 30, 30d supply, fill #0

## 2022-11-05 MED ORDER — CLOPIDOGREL BISULFATE 75 MG PO TABS
75.0000 mg | ORAL_TABLET | Freq: Every day | ORAL | 3 refills | Status: DC
  Filled 2022-11-05: qty 90, 90d supply, fill #0

## 2022-11-05 MED ORDER — ROSUVASTATIN CALCIUM 20 MG PO TABS
20.0000 mg | ORAL_TABLET | Freq: Every evening | ORAL | 3 refills | Status: DC
  Filled 2022-11-05: qty 90, 90d supply, fill #0

## 2022-11-05 MED ORDER — ISOSORBIDE MONONITRATE ER 60 MG PO TB24
60.0000 mg | ORAL_TABLET | Freq: Every day | ORAL | 1 refills | Status: DC
  Filled 2022-11-05: qty 30, 30d supply, fill #0

## 2022-11-05 MED ORDER — ASPIRIN 81 MG PO TBEC
81.0000 mg | DELAYED_RELEASE_TABLET | Freq: Every day | ORAL | 12 refills | Status: DC
  Filled 2022-11-05: qty 120, 120d supply, fill #0

## 2022-11-05 NOTE — Discharge Planning (Addendum)
Physician Discharge Summary  PATIENT ID: MANTON LATHER MRN: [id number] DOB/AGE: [birth year] 32 y.o.  ADMIT DATE: 11/04/2022 DISCHARGE DATE: 11/05/2022  PRIMARY DISCHARGE DIAGNOSIS: ST segment elevation MI involving the LCx Successful percutaneous coronary intervention Lcx/OM, Aspiration thrombectomy, PTCA with 3.0X12 mm balloon  SECONDARY DISCHARGE DIAGNOSIS: Coronary artery disease. Cocaine abuse Cigarette smoking  PROCEDURES /TESTING PERFORMED: Left heart catheterization with intervention. Echocardiogram.  CONSULTANTS: None  DISCHARGE RECOMMENDATIONS: Educated on importance of dual antiplatelet therapy for at least 1 year in the setting of ACS. 2.   Compliance with medical therapy.   3.   Complete cessation of smoking and cocaine use 4.   Importance of outpatient follow-up  CARDIAC DATABASE: EKG: 11/04/2022: Serial EKGs from EMS and ER reviewed. Sinus rhythm with dynamic inferior ST elevation, anterolateral reciprocal ST depression  Echocardiogram: 11/04/2022  1. Left ventricular ejection fraction, by estimation, is 55 to 60%. The  left ventricle has normal function. The left ventricle has no regional  wall motion abnormalities. There is mild left ventricular hypertrophy.  Left ventricular diastolic parameters  were normal.   2. Right ventricular systolic function is normal. The right ventricular  size is normal.   3. The mitral valve is normal in structure. Trivial mitral valve  regurgitation. No evidence of mitral stenosis.   4. The aortic valve is tricuspid. Aortic valve regurgitation is not  visualized. No aortic stenosis is present.   5. The inferior vena cava is normal in size with greater than 50%  respiratory variability, suggesting right atrial pressure of 3 mmHg.   6. Rhythm strip during this exam demonstrates normal sinus rhythm.   Heart Catheterization: 11/04/2022   Lat 1st Mrg lesion is 100% stenosed.   Balloon angioplasty was performed.   Post  intervention, there is a 0% residual stenosis.   LM: Normal LAD: Prox 30% disease Lcx: Culprit lesion flush occluded large bifurcating OM. RCA: Minimal luminal irregularities   LV: Global hypokinesis EF appeared to be <20%, should be confirmed with echocardiogram. LVEDP 21 mmHg.   Successful percutaneous coronary intervention Lcx/OM        PTCA with 3.0X12 mm balloon up to 8 atm   Residual stenosis 100%-->0% TIMI flow 0-->III  HOSPITAL COURSE:   32 y.o. African-American male  whose past medical history and cardiovascular risk factors include family history of premature coronary artery disease, cocaine abuse, cigarette smoker.  Patient has been doing cocaine since the age of 35 almost every day.  Prior to coming to the ED he had used cocaine few hours prior and started developing retrosternal chest pain.  EMS was called.  He was still having 10 out of 10 chest pain and EKG showing inferior ST segment elevation with anterior ST depressions.  Patient was Taken to the Cath Lab emergently to evaluate for obstructive CAD.  Patient noted to have CAD as outlined above and culprit lesion within the LCx was treated with aspiration thrombectomy PTCA with 3.0X12 mm balloon up to 8 atm angioplasty and percutaneous intervention.  Post procedure he has done well. Seen by cardiac rehab. Wishes to be discharged ASAP.   Telemetry reviewed - SR w/ ventricular ectopy (PVCs, runs, and short / brief NSVT). Will d/c norvasc and start diltiazem.   Medications resent to [facility] pharmacy.   Discharge Exam: Temp:  [98.1 F (36.7 C)-98.9 F (37.2 C)] 98.6 F (37 C) (06/22 0828) Pulse Rate:  [60-95] 76 (06/22 0900) Cardiac Rhythm: Normal sinus rhythm (06/22 0800) Resp:  [13-21] 17 (06/22 1000) BP: (96-154)/(61-104)  128/76 (06/22 1000) SpO2:  [95 %-99 %] 98 % (06/22 0900)  Today's Vitals   11/05/22 0800 11/05/22 0828 11/05/22 0900 11/05/22 1000  BP: 122/73 96/61 106/66 128/76  Pulse: 70 75 76   Resp:  18 17 20 17   Temp:  98.6 F (37 C)    TempSrc:  Oral    SpO2: 99% 97% 98%   Weight:      Height:      PainSc: 0-No pain      Body mass index is 23.09 kg/m.  Net IO Since Admission: 315.57 mL [11/05/22 1002]  Physical Exam  Constitutional:  Age appropriate, hemodynamically stable, no acute distress.   Neck: No JVD present.  Cardiovascular: Normal rate, regular rhythm, S1 normal and S2 normal. Exam reveals no gallop and no friction rub.  No murmur heard. Right radial site is healing well, no hematoma or bruit.  Pulmonary/Chest: Breath sounds normal. He has no wheezes. He has no rales. He exhibits no tenderness.  Abdominal: Soft. Bowel sounds are normal. He exhibits no distension. There is no abdominal tenderness.  Musculoskeletal:        General: No tenderness or edema.  Neurological: He is alert and oriented to person, place, and time.  Skin: Skin is warm and dry.   LABS:   Lab Results  Component Value Date   WBC 11.2 (H) 11/05/2022   HGB 16.8 11/05/2022   HCT 47.7 11/05/2022   MCV 86.6 11/05/2022   PLT 272 11/05/2022    Recent Labs  Lab 11/04/22 0453 11/05/22 0038  NA 137 135  K 4.1 3.6  CL 99 103  CO2 23 23  BUN 8 <5*  CREATININE 1.14 0.89  CALCIUM 9.5 9.4  PROT 7.2  --   BILITOT 0.4  --   ALKPHOS 62  --   ALT 14  --   AST 18  --   GLUCOSE 176* 110*    Lipid Panel  Lab Results  Component Value Date   CHOL 154 11/04/2022   HDL 37 (L) 11/04/2022   LDLCALC 102 (H) 11/04/2022   TRIG 73 11/04/2022   CHOLHDL 4.2 11/04/2022    BNP (last 3 results) No results for input(s): "BNP" in the last 8760 hours.  HEMOGLOBIN A1C Lab Results  Component Value Date   HGBA1C 4.9 11/04/2022   MPG 93.93 11/04/2022    Cardiac Panel (last 3 results) Recent Labs    11/04/22 0453 11/04/22 0814  TROPONINIHS 11 >24,000*    TSH No results for input(s): "TSH" in the last 8760 hours.   Radiology: ECHOCARDIOGRAM COMPLETE  Result Date: 11/05/2022     ECHOCARDIOGRAM REPORT   Patient Name:   TEJA ARREOLA Date of Exam: 11/04/2022 Medical Rec #:  [id number]         Height:       73.0 in Accession #:    [id number]        Weight:       175.0 lb Date of Birth:  [birth year]         BSA:          2.033 m Patient Age:    32 years          BP:           -/- mmHg Patient Gender: M                 HR:           63 bpm. Exam Location:  Inpatient Procedure: 2D Echo, Cardiac Doppler and Color Doppler Indications:    Acute myocardial infarction, unspecified I21.9  History:        Patient has no prior history of Echocardiogram examinations.  Sonographer:    Dondra Prader RVT RCS Referring Phys: [id number] [facility] J PATWARDHAN IMPRESSIONS  1. Left ventricular ejection fraction, by estimation, is 55 to 60%. The left ventricle has normal function. The left ventricle has no regional wall motion abnormalities. There is mild left ventricular hypertrophy. Left ventricular diastolic parameters were normal.  2. Right ventricular systolic function is normal. The right ventricular size is normal.  3. The mitral valve is normal in structure. Trivial mitral valve regurgitation. No evidence of mitral stenosis.  4. The aortic valve is tricuspid. Aortic valve regurgitation is not visualized. No aortic stenosis is present.  5. The inferior vena cava is normal in size with greater than 50% respiratory variability, suggesting right atrial pressure of 3 mmHg.  6. Rhythm strip during this exam demonstrates normal sinus rhythm. Comparison(s): No prior Echocardiogram. FINDINGS  Left Ventricle: Left ventricular ejection fraction, by estimation, is 55 to 60%. The left ventricle has normal function. The left ventricle has no regional wall motion abnormalities. The left ventricular internal cavity size was normal in size. There is  mild left ventricular hypertrophy. Left ventricular diastolic parameters were normal. Right Ventricle: The right ventricular size is normal. No increase in right ventricular wall  thickness. Right ventricular systolic function is normal. Left Atrium: Left atrial size was normal in size. Right Atrium: Right atrial size was normal in size. Pericardium: There is no evidence of pericardial effusion. Mitral Valve: The mitral valve is normal in structure. Trivial mitral valve regurgitation. No evidence of mitral valve stenosis. Tricuspid Valve: The tricuspid valve is normal in structure. Tricuspid valve regurgitation is not demonstrated. No evidence of tricuspid stenosis. Aortic Valve: The aortic valve is tricuspid. Aortic valve regurgitation is not visualized. No aortic stenosis is present. Aortic valve mean gradient measures 2.0 mmHg. Aortic valve peak gradient measures 3.2 mmHg. Aortic valve area, by VTI measures 3.00 cm. Pulmonic Valve: The pulmonic valve was normal in structure. Pulmonic valve regurgitation is trivial. No evidence of pulmonic stenosis. Aorta: The aortic root and ascending aorta are structurally normal, with no evidence of dilitation. Venous: The inferior vena cava is normal in size with greater than 50% respiratory variability, suggesting right atrial pressure of 3 mmHg. IAS/Shunts: The atrial septum is grossly normal. EKG: Rhythm strip during this exam demonstrates normal sinus rhythm.  LEFT VENTRICLE PLAX 2D LVIDd:         5.00 cm   Diastology LVIDs:         3.20 cm   LV e' medial:    5.66 cm/s LV PW:         1.30 cm   LV E/e' medial:  9.7 LV IVS:        1.00 cm   LV e' lateral:   9.14 cm/s LVOT diam:     2.20 cm   LV E/e' lateral: 6.0 LV SV:         50 LV SV Index:   24 LVOT Area:     3.80 cm  RIGHT VENTRICLE             IVC RV Basal diam:  3.00 cm     IVC diam: 1.60 cm RV Mid diam:    2.60 cm RV S prime:     11.20 cm/s TAPSE (M-mode): 2.7 cm LEFT  ATRIUM             Index        RIGHT ATRIUM           Index LA diam:        3.10 cm 1.52 cm/m   RA Area:     13.60 cm LA Vol (A2C):   51.8 ml 25.48 ml/m  RA Volume:   32.60 ml  16.04 ml/m LA Vol (A4C):   44.4 ml 21.82 ml/m  LA Biplane Vol: 58.6 ml 28.83 ml/m  AORTIC VALVE                    PULMONIC VALVE AV Area (Vmax):    3.33 cm     PV Vmax:          0.94 m/s AV Area (Vmean):   3.07 cm     PV Peak grad:     3.5 mmHg AV Area (VTI):     3.00 cm     PR End Diast Vel: 3.32 msec AV Vmax:           89.20 cm/s AV Vmean:          56.500 cm/s AV VTI:            0.166 m AV Peak Grad:      3.2 mmHg AV Mean Grad:      2.0 mmHg LVOT Vmax:         78.20 cm/s LVOT Vmean:        45.600 cm/s LVOT VTI:          0.131 m LVOT/AV VTI ratio: 0.79  AORTA Ao Root diam: 3.30 cm Ao Asc diam:  2.80 cm MITRAL VALVE MV Area (PHT): 5.02 cm    SHUNTS MV Decel Time: 151 msec    Systemic VTI:  0.13 m MV E velocity: 55.10 cm/s  Systemic Diam: 2.20 cm MV A velocity: 32.80 cm/s MV E/A ratio:  1.68 Earnstine Meinders DO Electronically signed by Tessa Lerner DO Signature Date/Time: 11/05/2022/8:53:42 AM    Final    CARDIAC CATHETERIZATION  Result Date: 11/04/2022 Images from the original result were not included.   Lat 1st Mrg lesion is 100% stenosed.   Balloon angioplasty was performed.   Post intervention, there is a 0% residual stenosis. LM: Normal LAD: Prox 30% disease Lcx: Culprit lesion flush occluded large bifurcating OM. RCA: Minimal luminal irregularities LV: Global hypokinesis EF appeared to be <20%, should be confirmed with echocardiogram. LVEDP 21 mmHg. Successful percutaneous coronary intervention Lcx/OM        PTCA with 3.0X12 mm balloon up to 8 atm Residual stenosis 100%-->0% TIMI flow 0-->III I spoke with the patient and his fiance again and emphasized importance of absolute cocaine abstinence.  Given his premature coronary artery disease, I do suspect genetic component as well.  His father had fatal MI in 2s.  Will check lipid panel, lipoprotein a, A1c. Patient will be admitted to ICU.  I remain concerned about his EF, therefore may need to monitor for >24 hours. Elder Negus, MD Pager: [phone] Office: [phone]  DG Chest Port 1  View  Result Date: 11/04/2022 CLINICAL DATA:  Chest pain. EXAM: PORTABLE CHEST 1 VIEW COMPARISON:  10/18/2013. FINDINGS: The heart size and mediastinal contours are within normal limits. Both lungs are clear. No acute osseous abnormality. IMPRESSION: No active disease. Electronically Signed   By: Thornell Sartorius M.D.   On: 11/04/2022 04:59  FOLLOW UP PLANS AND APPOINTMENTS Discharge Instructions     AMB Referral to Cardiac Rehabilitation - Phase II   Complete by: As directed    Diagnosis:  Coronary Stents STEMI     After initial evaluation and assessments completed: Virtual Based Care may be provided alone or in conjunction with Phase 2 Cardiac Rehab based on patient barriers.: Yes   Intensive Cardiac Rehabilitation (ICR) [facility] location only OR Traditional Cardiac Rehabilitation (TCR) *If criteria for ICR are not met will enroll in TCR [facility] only): Yes      Allergies as of 11/05/2022   No Known Allergies      Medication List     TAKE these medications    acetaminophen 325 MG tablet Commonly known as: TYLENOL Take 2 tablets (650 mg total) by mouth every 4 (four) hours as needed for headache or mild pain. What changed:  medication strength how much to take when to take this reasons to take this   aspirin EC 81 MG tablet Take 1 tablet (81 mg total) by mouth daily. Swallow whole.   clopidogrel 75 MG tablet Commonly known as: PLAVIX Take 1 tablet (75 mg total) by mouth daily.   diltiazem 120 MG 24 hr capsule Commonly known as: Cardizem CD Take 1 capsule (120 mg total) by mouth in the morning.   isosorbide mononitrate 60 MG 24 hr tablet Commonly known as: IMDUR Take 1 tablet (60 mg total) by mouth daily at 10 pm.   lisinopril 20 MG tablet Commonly known as: ZESTRIL Take 1 tablet (20 mg total) by mouth every morning.   nitroGLYCERIN 0.4 MG SL tablet Commonly known as: Nitrostat Place 1 tablet (0.4 mg total) under the tongue every 5 (five) minutes as needed for chest  pain.   rosuvastatin 20 MG tablet Commonly known as: CRESTOR Take 1 tablet (20 mg total) by mouth at bedtime.        Total time spent on patient's discharge was 38 minutes.   Delilah Shan [facility]  Pager:  [phone] Office: [phone]  ADDENDUM: Beta-blockers were not prescribed at the time of discharge due to cocaine abuse.  Hartley Wyke Torrey, DO, [facility] 01/09/23 12:08 AM

## 2022-11-05 NOTE — Progress Notes (Signed)
CARDIAC REHAB PHASE I   PRE:  Rate/Rhythm: NSR/83  BP:  Sitting: 119/75      SaO2: 96%  MODE:  Ambulation: 600 ft   POST:  Rate/Rhythm: NSR/96  BP:  Sitting: 128/76      SaO2: 97%  Pt ambulated with strong, steady gait. Pt had no C/O SOB, dizziness or pain with ambulation. Pt back to bedside with fiance at side. Pt is anxious to go home.  Reviewed post STEMI/cath care. Discussed precautions, limitations and S/S to report to MD. Stressed to the patient the importance of smoking cessation and stopping cocaine use. Pt with significant family history of CAD. Stressed medication compliance especially Plavix/asa. Pt and his fiance verbalized understanding of the education. All questions answered. Stressed f/u with Dr. Rosemary Holms. Referral for the [facility] program already placed, however patient is currently uninsured.   Lorin Picket, MS, ACSM EP-C, [facility] 11/05/2022

## 2022-11-05 NOTE — Progress Notes (Signed)
Assisted pt with his DC meds through the [facility] program. [facility] filled the prescriptions.

## 2022-11-07 ENCOUNTER — Encounter (HOSPITAL_COMMUNITY): Payer: Self-pay | Admitting: Cardiology

## 2022-11-07 ENCOUNTER — Other Ambulatory Visit (HOSPITAL_COMMUNITY): Payer: Self-pay

## 2022-11-07 ENCOUNTER — Telehealth: Payer: Self-pay

## 2022-11-07 NOTE — Telephone Encounter (Signed)
LMTCB

## 2022-11-08 LAB — LIPOPROTEIN A (LPA): Lipoprotein (a): 258.6 nmol/L — ABNORMAL HIGH (ref <75.0)

## 2022-11-08 NOTE — Telephone Encounter (Signed)
Tried calling pt for TOC no answer

## 2022-11-09 NOTE — Discharge Summary (Signed)
Please see discharge planning documentation.   Timya Trimmer [place], DO, [facility]

## 2022-11-14 ENCOUNTER — Telehealth (HOSPITAL_COMMUNITY): Payer: Self-pay

## 2022-11-14 NOTE — Telephone Encounter (Signed)
Called patient to see if he was interested in participating in the Cardiac Rehab Program. Patient stated No he was not interested. Did explain he has up to a year if anything changes. I have closed his referral.

## 2022-11-15 ENCOUNTER — Encounter: Payer: Self-pay | Admitting: Cardiology

## 2022-11-15 ENCOUNTER — Ambulatory Visit: Payer: BLUE CROSS/BLUE SHIELD | Admitting: Cardiology

## 2022-11-15 VITALS — BP 106/68 | HR 68 | Resp 16 | Ht 73.0 in | Wt 166.6 lb

## 2022-11-15 DIAGNOSIS — I25118 Atherosclerotic heart disease of native coronary artery with other forms of angina pectoris: Secondary | ICD-10-CM | POA: Insufficient documentation

## 2022-11-15 DIAGNOSIS — I2121 ST elevation (STEMI) myocardial infarction involving left circumflex coronary artery: Secondary | ICD-10-CM

## 2022-11-15 DIAGNOSIS — I2510 Atherosclerotic heart disease of native coronary artery without angina pectoris: Secondary | ICD-10-CM

## 2022-11-15 DIAGNOSIS — F1410 Cocaine abuse, uncomplicated: Secondary | ICD-10-CM

## 2022-11-15 DIAGNOSIS — I252 Old myocardial infarction: Secondary | ICD-10-CM

## 2022-11-15 DIAGNOSIS — F17210 Nicotine dependence, cigarettes, uncomplicated: Secondary | ICD-10-CM

## 2022-11-15 MED ORDER — DILTIAZEM HCL ER COATED BEADS 120 MG PO CP24
120.0000 mg | ORAL_CAPSULE | Freq: Every morning | ORAL | 3 refills | Status: DC
Start: 2022-11-15 — End: 2023-11-20

## 2022-11-15 MED ORDER — ROSUVASTATIN CALCIUM 20 MG PO TABS
20.0000 mg | ORAL_TABLET | Freq: Every evening | ORAL | 3 refills | Status: DC
Start: 2022-11-15 — End: 2024-02-26

## 2022-11-15 MED ORDER — ASPIRIN 81 MG PO TBEC: 81.0000 mg | DELAYED_RELEASE_TABLET | Freq: Every day | ORAL | 3 refills | Status: DC

## 2022-11-15 MED ORDER — NITROGLYCERIN 0.4 MG SL SUBL
0.4000 mg | SUBLINGUAL_TABLET | SUBLINGUAL | 1 refills | Status: DC | PRN
Start: 2022-11-15 — End: 2023-03-30

## 2022-11-15 MED ORDER — CLOPIDOGREL BISULFATE 75 MG PO TABS
75.0000 mg | ORAL_TABLET | Freq: Every day | ORAL | 3 refills | Status: DC
Start: 2022-11-15 — End: 2023-11-20

## 2022-11-15 NOTE — Progress Notes (Signed)
Subjective:   Miguel Lucas, male    DOB: 28-Aug-1990, 32 y.o.   MRN: [id number]   HPI  Chief Complaint  Patient presents with   Hospitalization Follow-up   STEMI involving left circumflex coronary artery    32 y.o.  African American male with cocaine abuse, elevated Lp(a), family h/o early CAD (father had fatal MI in 63s), s/p primary PTCA for STEMI (10/2022)  Patient presented with lateral STEMI due to complete flush occlusion of large bifurcating OM branch, for which she underwent successful PTCA.  Stent placement was deferred due to concern for compliance with medical therapy and risk of stent thrombosis.  Patient has not had any recurrent chest pains since discharge.  He is back to his baseline physical activity without any complaints.  Fortunately, has not used cocaine, but continues to smoke 1 pack a day.  He currently does not have insurance, and is hoping to get Medicaid.  He is here today with his girlfriend Miguel Lucas, who works at [employer], lives with the patient.  Patient is currently not working.    Current Outpatient Medications:    acetaminophen (TYLENOL) 325 MG tablet, Take 2 tablets (650 mg total) by mouth every 4 (four) hours as needed for headache or mild pain., Disp: , Rfl:    aspirin EC 81 MG tablet, Take 1 tablet (81 mg total) by mouth daily. Swallow whole., Disp: 120 tablet, Rfl: 12   clopidogrel (PLAVIX) 75 MG tablet, Take 1 tablet (75 mg total) by mouth daily., Disp: 90 tablet, Rfl: 3   diltiazem (CARDIZEM CD) 120 MG 24 hr capsule, Take 1 capsule (120 mg total) by mouth in the morning., Disp: 30 capsule, Rfl: 3   isosorbide mononitrate (IMDUR) 60 MG 24 hr tablet, Take 1 tablet (60 mg total) by mouth daily at 10 pm., Disp: 30 tablet, Rfl: 1   lisinopril (ZESTRIL) 20 MG tablet, Take 1 tablet (20 mg total) by mouth every morning., Disp: 30 tablet, Rfl: 1   nitroGLYCERIN (NITROSTAT) 0.4 MG SL tablet, Place 1 tablet (0.4 mg total) under the tongue every 5  (five) minutes as needed for chest pain., Disp: 25 tablet, Rfl: 1   rosuvastatin (CRESTOR) 20 MG tablet, Take 1 tablet (20 mg total) by mouth at bedtime., Disp: 90 tablet, Rfl: 3   Cardiovascular & other pertient studies:  Reviewed external labs and tests, independently interpreted  EKG 11/15/2022: Sinus rhythm 60 bpm Rightward axis Laterla infarct, age indeterminate  Nonspecific T-abnormality  Echocardiogram 11/04/2022: 1. Left ventricular ejection fraction, by estimation, is 55 to 60%. The  left ventricle has normal function. The left ventricle has no regional  wall motion abnormalities. There is mild left ventricular hypertrophy.  Left ventricular diastolic parameters  were normal.   2. Right ventricular systolic function is normal. The right ventricular  size is normal.   3. The mitral valve is normal in structure. Trivial mitral valve  regurgitation. No evidence of mitral stenosis.   4. The aortic valve is tricuspid. Aortic valve regurgitation is not  visualized. No aortic stenosis is present.   5. The inferior vena cava is normal in size with greater than 50%  respiratory variability, suggesting right atrial pressure of 3 mmHg.   6. Rhythm strip during this exam demonstrates normal sinus rhythm.   Comparison(s): No prior Echocardiogram.   Coronary intervention 11/04/2022: LM: Normal LAD: Prox 30% disease Lcx: Culprit lesion flush occluded large bifurcating OM. RCA: Minimal luminal irregularities   LV: Global hypokinesis  EF appeared to be <20%, should be confirmed with echocardiogram. LVEDP 21 mmHg.   Successful percutaneous coronary intervention Lcx/OM        PTCA with 3.0X12 mm balloon up to 8 atm   Residual stenosis 100%-->0% TIMI flow 0-->III  Recent labs: 11/05/2022: Glucose 110, BUN/Cr <5/0.89. EGFR >60. Na/K 135/3.6. Rest of the CMP normal H/H 16/47. MCV 86. Platelets 272 HbA1C 4.9% Chol 154, TG 73, HDL 37, LDL 102 Lipoprotein (a) 258 TSH NA    Review  of Systems  Cardiovascular:  Negative for chest pain, dyspnea on exertion, leg swelling, palpitations and syncope.         Vitals:   11/15/22 1502  BP: 106/68  Pulse: 68  Resp: 16  SpO2: 98%    Body mass index is 21.98 kg/m. Filed Weights   11/15/22 1502  Weight: 166 lb 9.6 oz (75.6 kg)     Objective:   Physical Exam Vitals and nursing note reviewed.  Constitutional:      General: He is not in acute distress. Neck:     Vascular: No JVD.  Cardiovascular:     Rate and Rhythm: Normal rate and regular rhythm.     Heart sounds: Normal heart sounds. No murmur heard. Pulmonary:     Effort: Pulmonary effort is normal.     Breath sounds: Normal breath sounds. No wheezing or rales.             Visit diagnoses:   ICD-10-CM   1. STEMI involving left circumflex coronary artery (HCC)  I21.21 EKG 12-Lead       Orders Placed This Encounter  Procedures   EKG 12-Lead     Medication changes this visit: Meds ordered this encounter  Medications   rosuvastatin (CRESTOR) 20 MG tablet    Sig: Take 1 tablet (20 mg total) by mouth at bedtime.    Dispense:  90 tablet    Refill:  3   nitroGLYCERIN (NITROSTAT) 0.4 MG SL tablet    Sig: Place 1 tablet (0.4 mg total) under the tongue every 5 (five) minutes as needed for chest pain.    Dispense:  25 tablet    Refill:  1   diltiazem (CARDIZEM CD) 120 MG 24 hr capsule    Sig: Take 1 capsule (120 mg total) by mouth in the morning.    Dispense:  90 capsule    Refill:  3   clopidogrel (PLAVIX) 75 MG tablet    Sig: Take 1 tablet (75 mg total) by mouth daily.    Dispense:  90 tablet    Refill:  3   aspirin EC 81 MG tablet    Sig: Take 1 tablet (81 mg total) by mouth daily. Swallow whole.    Dispense:  90 tablet    Refill:  3     Assessment & Recommendations:   32 y.o. African American male with cocaine abuse, elevated Lp(a), family h/o early CAD (father had fatal MI in 38s), s/p primary PTCA for STEMI  (10/2022)  CAD: STEMI 10/2022. S/p PTCA to culprit vessel OM. With no insurance, and concern for compliance, I will minimize his medications to the ones that are absolutely needed. Continue aspirin and Plavix at least till 10/2023. Continue diltiazem 100 mg daily, Crestor 20 mg daily. Also prescribed sublingual as requested.  Elevated lipoprotein (a): In future, he could be considered for clinical trial participation.  Continue Crestor 10 mg daily for now.  Nicotine and tobacco abuse: I spent considerable time with  the patient emphasizing importance of nicotine as well as smoking cessation.  Patient was emotional through this conversation, and I hope he has insight.  He is willing to quit cocaine, has not used it since hospital discharge and states that he "does not need it, but used to do because he wanted it".  He is still not ready to quit smoking yet.   F/u in 3 months     Elder Negus, MD Pager: [phone] Office: [phone]

## 2023-03-01 DIAGNOSIS — E7841 Elevated Lipoprotein(a): Secondary | ICD-10-CM | POA: Insufficient documentation

## 2023-03-01 NOTE — Progress Notes (Deleted)
Cardiology Office Note:  .   Date:  03/01/2023  ID:  Miguel Lucas, DOB 11/18/1990, MRN [id number] PCP: Patient, No Pcp Per  [facility] Providers Cardiologist:  Truett Mainland, MD PCP: Patient, No Pcp Per  No chief complaint on file.     History of Present Illness: .    Miguel Lucas is a 32 y.o. male with cocaine abuse, elevated Lp(a), family h/o early CAD (father had fatal MI in 48s), s/p primary PTCA for STEMI (10/2022)   There were no vitals filed for this visit.   ROS: *** ROS   Studies Reviewed: .       Echocardiogram 11/04/2022: 1. Left ventricular ejection fraction, by estimation, is 55 to 60%. The  left ventricle has normal function. The left ventricle has no regional  wall motion abnormalities. There is mild left ventricular hypertrophy.  Left ventricular diastolic parameters  were normal.   2. Right ventricular systolic function is normal. The right ventricular  size is normal.   3. The mitral valve is normal in structure. Trivial mitral valve  regurgitation. No evidence of mitral stenosis.   4. The aortic valve is tricuspid. Aortic valve regurgitation is not  visualized. No aortic stenosis is present.   5. The inferior vena cava is normal in size with greater than 50%  respiratory variability, suggesting right atrial pressure of 3 mmHg.   6. Rhythm strip during this exam demonstrates normal sinus rhythm.   Comparison(s): No prior Echocardiogram.    Coronary intervention 11/04/2022: LM: Normal LAD: Prox 30% disease Lcx: Culprit lesion flush occluded large bifurcating OM. RCA: Minimal luminal irregularities   LV: Global hypokinesis EF appeared to be <20%, should be confirmed with echocardiogram. LVEDP 21 mmHg.   Successful percutaneous coronary intervention Lcx/OM        PTCA with 3.0X12 mm balloon up to 8 atm   Residual stenosis 100%-->0% TIMI flow 0-->III *** Risk Assessment/Calculations:   {Does this patient have ATRIAL  FIBRILLATION?:[phone]}   Physical Exam:   Physical Exam   VISIT DIAGNOSES: No diagnosis found.   ASSESSMENT AND PLAN: .    Miguel Lucas is a 32 y.o. male with cocaine abuse, elevated Lp(a), family h/o early CAD (father had fatal MI in 47s), s/p primary PTCA for STEMI (10/2022)   CAD: STEMI 10/2022. S/p PTCA to culprit vessel OM. With no insurance, and concern for compliance, I minimized his medications to the ones that are absolutely needed. Continue aspirin and Plavix at least till 10/2023. Continue diltiazem 100 mg daily, Crestor 20 mg daily. Also prescribed sublingual as requested.   Elevated lipoprotein (a): Elevated at 258. In future, he could be considered for clinical trial participation.  Continue Crestor 10 mg daily for now.   *** Nicotine and tobacco abuse: I spent considerable time with the patient emphasizing importance of nicotine as well as smoking cessation.  Patient was emotional through this conversation, and I hope he has insight.  He is willing to quit cocaine, has not used it since hospital discharge and states that he "does not need it, but used to do because he wanted it".  He is still not ready to quit smoking yet.  {Are you ordering a CV Procedure (e.g. stress test, cath, DCCV, TEE, etc)?   Press F2        :[id number]}    No orders of the defined types were placed in this encounter.    F/u in ***  Signed, Elder Negus, MD

## 2023-03-02 ENCOUNTER — Ambulatory Visit: Payer: BLUE CROSS/BLUE SHIELD | Admitting: Cardiology

## 2023-03-02 DIAGNOSIS — I2121 ST elevation (STEMI) myocardial infarction involving left circumflex coronary artery: Secondary | ICD-10-CM

## 2023-03-02 DIAGNOSIS — E7841 Elevated Lipoprotein(a): Secondary | ICD-10-CM

## 2023-03-02 DIAGNOSIS — I2510 Atherosclerotic heart disease of native coronary artery without angina pectoris: Secondary | ICD-10-CM

## 2023-03-02 DIAGNOSIS — F1410 Cocaine abuse, uncomplicated: Secondary | ICD-10-CM

## 2023-03-09 ENCOUNTER — Ambulatory Visit: Payer: BLUE CROSS/BLUE SHIELD | Attending: Cardiology | Admitting: Cardiology

## 2023-03-10 ENCOUNTER — Encounter: Payer: Self-pay | Admitting: Cardiology

## 2023-03-30 ENCOUNTER — Ambulatory Visit: Payer: BLUE CROSS/BLUE SHIELD | Attending: Cardiology | Admitting: Cardiology

## 2023-03-30 ENCOUNTER — Other Ambulatory Visit: Payer: Self-pay | Admitting: Cardiology

## 2023-03-30 DIAGNOSIS — I2510 Atherosclerotic heart disease of native coronary artery without angina pectoris: Secondary | ICD-10-CM

## 2023-11-19 ENCOUNTER — Other Ambulatory Visit: Payer: Self-pay | Admitting: Cardiology

## 2023-11-19 DIAGNOSIS — I2510 Atherosclerotic heart disease of native coronary artery without angina pectoris: Secondary | ICD-10-CM

## 2024-02-26 ENCOUNTER — Telehealth: Payer: Self-pay | Admitting: Cardiology

## 2024-02-26 DIAGNOSIS — I2510 Atherosclerotic heart disease of native coronary artery without angina pectoris: Secondary | ICD-10-CM

## 2024-02-26 MED ORDER — CLOPIDOGREL BISULFATE 75 MG PO TABS
75.0000 mg | ORAL_TABLET | Freq: Every day | ORAL | 0 refills | Status: AC
Start: 2024-02-26 — End: ?

## 2024-02-26 MED ORDER — DILTIAZEM HCL ER COATED BEADS 120 MG PO CP24: 120.0000 mg | ORAL_CAPSULE | Freq: Every morning | ORAL | 0 refills | Status: AC

## 2024-02-26 MED ORDER — ROSUVASTATIN CALCIUM 20 MG PO TABS
20.0000 mg | ORAL_TABLET | Freq: Every evening | ORAL | 0 refills | Status: AC
Start: 2024-02-26 — End: ?

## 2024-02-26 NOTE — Telephone Encounter (Signed)
*  STAT* If patient is at the pharmacy, call can be transferred to refill team.   1. Which medications need to be refilled? (please list name of each medication and dose if known)   clopidogrel  (PLAVIX ) 75 MG tablet    diltiazem  (CARDIZEM  CD) 120 MG 24 hr capsule    rosuvastatin  (CRESTOR ) 20 MG tablet (Expired) Take 1 tablet (20 mg total) by mouth at bedtime.      2. Would you like to learn more about the convenience, safety, & potential cost savings by using the [facility]? No    3. Are you open to using the [facility] (Type [facility]. No    4. Which pharmacy/location (including street and city if local pharmacy) is medication to be sent to? [id number] - [place], [place] - [address] AT [facility] OF [address]     5. Do they need a 30 day or 90 day supply? 30 day   Pt has appt scheduled 04/10/24

## 2024-02-26 NOTE — Telephone Encounter (Signed)
 RX sent in

## 2024-02-29 ENCOUNTER — Other Ambulatory Visit: Payer: Self-pay

## 2024-02-29 ENCOUNTER — Emergency Department (HOSPITAL_COMMUNITY)
Admission: EM | Admit: 2024-02-29 | Discharge: 2024-02-29 | Disposition: A | Discharge status: Home / Self Care | Payer: MEDICAID | Attending: Emergency Medicine | Admitting: Emergency Medicine

## 2024-02-29 ENCOUNTER — Encounter (HOSPITAL_COMMUNITY): Payer: Self-pay | Admitting: *Deleted

## 2024-02-29 ENCOUNTER — Emergency Department (HOSPITAL_COMMUNITY): Payer: MEDICAID

## 2024-02-29 DIAGNOSIS — F1490 Cocaine use, unspecified, uncomplicated: Secondary | ICD-10-CM | POA: Diagnosis not present

## 2024-02-29 DIAGNOSIS — F17210 Nicotine dependence, cigarettes, uncomplicated: Secondary | ICD-10-CM | POA: Insufficient documentation

## 2024-02-29 DIAGNOSIS — R079 Chest pain, unspecified: Secondary | ICD-10-CM | POA: Diagnosis present

## 2024-02-29 DIAGNOSIS — D72829 Elevated white blood cell count, unspecified: Secondary | ICD-10-CM | POA: Diagnosis not present

## 2024-02-29 LAB — CBC
HCT: 44.6 % (ref 39.0–52.0)
Hemoglobin: 15.6 g/dL (ref 13.0–17.0)
MCH: 30.8 pg (ref 26.0–34.0)
MCHC: 35 g/dL (ref 30.0–36.0)
MCV: 88.1 fL (ref 80.0–100.0)
Platelets: 272 K/uL (ref 150–400)
RBC: 5.06 MIL/uL (ref 4.22–5.81)
RDW: 13.4 % (ref 11.5–15.5)
WBC: 12.8 K/uL — ABNORMAL HIGH (ref 4.0–10.5)
nRBC: 0 % (ref 0.0–0.2)

## 2024-02-29 LAB — TROPONIN I (HIGH SENSITIVITY)
Troponin I (High Sensitivity): 6 ng/L (ref <18)
Troponin I (High Sensitivity): 7 ng/L (ref <18)

## 2024-02-29 LAB — BASIC METABOLIC PANEL WITH GFR
Anion gap: 12 (ref 5–15)
BUN: 10 mg/dL (ref 6–20)
CO2: 24 mmol/L (ref 22–32)
Calcium: 9.2 mg/dL (ref 8.9–10.3)
Chloride: 100 mmol/L (ref 98–111)
Creatinine, Ser: 0.95 mg/dL (ref 0.61–1.24)
GFR, Estimated: >60 mL/min (ref >60)
Glucose, Bld: 103 mg/dL — ABNORMAL HIGH (ref 70–99)
Potassium: 3.9 mmol/L (ref 3.5–5.1)
Sodium: 136 mmol/L (ref 135–145)

## 2024-02-29 MED ORDER — LORAZEPAM 1 MG PO TABS
1.0000 mg | ORAL_TABLET | Freq: Once | ORAL | Status: AC
  Administered 2024-02-29: 1 mg via ORAL
  Filled 2024-02-29: qty 1

## 2024-02-29 NOTE — Discharge Instructions (Signed)
 You were evaluated in the Emergency Department and after careful evaluation, we did not find any emergent condition requiring admission or further testing in the hospital.  Your exam/testing today is overall reassuring.  Very important that you stop using cocaine and follow-up with your cardiologist.  Please return to the Emergency Department if you experience any worsening of your condition.   Thank you for allowing us  to be a part of your care.

## 2024-02-29 NOTE — ED Provider Notes (Signed)
 [facility]PT [facility] Emergency Department Provider Note MRN:  [id number]  Arrival date & time: 02/29/24     Chief Complaint   Chest Pain   History of Present Illness   Miguel Lucas is a 33 y.o. year-old male with a history of cocaine use, MI presenting to the ED with chief complaint of chest pain.  Patient has been out of his medicine for the past few months due to cost.  Has been having chest pain intermittently for the past few weeks.  Chest pain became much worse this evening during cocaine use, here for evaluation.  Over the past hour pain has calm down, currently 3 out of 10.  Described as a pressure type pain.  Review of Systems  A thorough review of systems was obtained and all systems are negative except as noted in the HPI and PMH.   Patient's Health History   History reviewed. No pertinent past medical history.  Past Surgical History:  Procedure Laterality Date   CORONARY/GRAFT ACUTE MI REVASCULARIZATION N/A 11/04/2022   Procedure: Coronary/Graft Acute MI Revascularization;  Surgeon: Elmira Newman PARAS, MD;  Location: [facility] INVASIVE CV LAB;  Service: Cardiovascular;  Laterality: N/A;    Family History  Problem Relation Age of Onset   Heart disease Mother    Heart disease Brother     Social History   Socioeconomic History   Marital status: Single    Spouse name: Not on file   Number of children: Not on file   Years of education: Not on file   Highest education level: Not on file  Occupational History   Not on file  Tobacco Use   Smoking status: Every Day    Current packs/day: 1.00    Types: Cigarettes   Smokeless tobacco: Never  Substance and Sexual Activity   Alcohol use: No   Drug use: Yes    Frequency: 7.0 times per week    Types: Cocaine, Crack cocaine   Sexual activity: Yes  Other Topics Concern   Not on file  Social History Narrative   Not on file   Social Drivers of Health   Financial Resource Strain: Not on file  Food  Insecurity: No Food Insecurity (11/04/2022)   Hunger Vital Sign    Worried About Running Out of Food in the Last Year: Never true    Ran Out of Food in the Last Year: Never true  Transportation Needs: No Transportation Needs (11/04/2022)   PRAPARE - Administrator, Civil Service (Medical): No    Lack of Transportation (Non-Medical): No  Physical Activity: Not on file  Stress: Not on file  Social Connections: Not on file  Intimate Partner Violence: Not At Risk (11/04/2022)   Humiliation, Afraid, Rape, and Kick questionnaire    Fear of Current or Ex-Partner: No    Emotionally Abused: No    Physically Abused: No    Sexually Abused: No     Physical Exam   Vitals:   02/29/24 0545 02/29/24 0556  BP: (!) 150/116   Pulse: 98   Resp: 15   Temp:  98.6 F (37 C)  SpO2: 100%     CONSTITUTIONAL: Well-appearing, NAD NEURO/PSYCH:  Alert and oriented x 3, no focal deficits EYES:  eyes equal and reactive ENT/NECK:  no LAD, no JVD CARDIO: Regular rate, well-perfused, normal S1 and S2 PULM:  CTAB no wheezing or rhonchi GI/GU:  non-distended, non-tender MSK/SPINE:  No gross deformities, no edema SKIN:  no rash, atraumatic   *  Additional and/or pertinent findings included in MDM below  Diagnostic and Interventional Summary    EKG Interpretation Date/Time:    Ventricular Rate:    PR Interval:    QRS Duration:    QT Interval:    QTC Calculation:   R Axis:      Text Interpretation:         Labs Reviewed  CBC - Abnormal; Notable for the following components:      Result Value   WBC 12.8 (*)    All other components within normal limits  BASIC METABOLIC PANEL WITH GFR - Abnormal; Notable for the following components:   Glucose, Bld 103 (*)    All other components within normal limits  TROPONIN I (HIGH SENSITIVITY)  TROPONIN I (HIGH SENSITIVITY)    DG Chest Port 1 View  Final Result      Medications  LORazepam (ATIVAN) tablet 1 mg (1 mg Oral Given 02/29/24 0234)      Procedures  /  Critical Care Procedures  ED Course and Medical Decision Making  Initial Impression and Ddx Patient is only 33 but has already had PCI related to a STEMI last year.  100% stenosis of the first marginal, intervened with balloon.  Has evidence of 30% LAD stenosis as well based on catheterization at that time.  Cardiology suspecting a genetic component.  The cocaine is not helping.  Well-appearing at this time, reassuring vital signs.  Awaiting EKG, labs, chest x-ray.  Past medical/surgical history that increases complexity of ED encounter: CAD, cocaine use  Interpretation of Diagnostics I personally reviewed the EKG and my interpretation is as follows: Sinus rhythm, benign early repolarization  No significant blood count or electrolyte disturbance.  Troponin negative x 2  Patient Reassessment and Ultimate Disposition/Management     Patient resting comfortably with no pain over the past couple hours.  Given this and reassuring workup patient appropriate for discharge with counseling to stop cocaine use.  Patient expresses understanding that he needs to stop cocaine immediately and he causes him great risk of harm.  Patient management required discussion with the following services or consulting groups:  None  Complexity of Problems Addressed Acute illness or injury that poses threat of life of bodily function  Additional Data Reviewed and Analyzed Further history obtained from: Further history from spouse/family member  Additional Factors Impacting ED Encounter Risk Consideration of hospitalization  Miguel HERO. Theadore, MD [facility] Emergency Medicine [facility] [email or web address]  Final Clinical Impressions(s) / ED Diagnoses     ICD-10-CM   1. Chest pain, unspecified type  R07.9     2. Cocaine use  F14.90       ED Discharge Orders     None        Discharge Instructions Discussed with and Provided to Patient:     Discharge  Instructions      You were evaluated in the Emergency Department and after careful evaluation, we did not find any emergent condition requiring admission or further testing in the hospital.  Your exam/testing today is overall reassuring.  Very important that you stop using cocaine and follow-up with your cardiologist.  Please return to the Emergency Department if you experience any worsening of your condition.   Thank you for allowing us  to be a part of your care.       Lucas Miguel HERO, MD 02/29/24 [phone]

## 2024-02-29 NOTE — ED Triage Notes (Signed)
 The pt reports that he is having chest pain after he snorted cocaine.  He had a mi last year using drugs he also reports having  trouble breathing

## 2024-03-13 ENCOUNTER — Other Ambulatory Visit: Payer: Self-pay

## 2024-03-13 ENCOUNTER — Ambulatory Visit: Payer: MEDICAID | Attending: Cardiology | Admitting: Cardiology

## 2024-03-13 ENCOUNTER — Other Ambulatory Visit (HOSPITAL_COMMUNITY): Payer: Self-pay

## 2024-03-13 ENCOUNTER — Encounter: Payer: Self-pay | Admitting: Cardiology

## 2024-03-13 VITALS — BP 116/86 | HR 80 | Ht 72.0 in | Wt 161.0 lb

## 2024-03-13 DIAGNOSIS — I25118 Atherosclerotic heart disease of native coronary artery with other forms of angina pectoris: Secondary | ICD-10-CM | POA: Diagnosis not present

## 2024-03-13 DIAGNOSIS — E7841 Elevated Lipoprotein(a): Secondary | ICD-10-CM | POA: Diagnosis not present

## 2024-03-13 DIAGNOSIS — F17210 Nicotine dependence, cigarettes, uncomplicated: Secondary | ICD-10-CM | POA: Diagnosis not present

## 2024-03-13 DIAGNOSIS — I252 Old myocardial infarction: Secondary | ICD-10-CM | POA: Diagnosis not present

## 2024-03-13 MED ORDER — BUPROPION HCL ER (SR) 100 MG PO TB12
100.0000 mg | ORAL_TABLET | Freq: Two times a day (BID) | ORAL | 2 refills | Status: AC
  Filled 2024-03-13: qty 180, 90d supply, fill #0
  Filled 2024-03-13: qty 60, 30d supply, fill #0

## 2024-03-13 NOTE — Progress Notes (Signed)
 Patient Cardiology Office Note:  .   Date:  03/13/2024  ID:  Miguel Lucas, DOB 11/11/1990, MRN [id number] PCP: Patient, No Pcp Per  [facility] Providers Cardiologist:  Newman Lawrence, MD PCP: Patient, No Pcp Per  Chief Complaint  Patient presents with   Chest Pain     Miguel Lucas is a 33 y.o. male with cocaine abuse, elevated Lp(a), family h/o early CAD (father had fatal MI in 58s), s/p primary PTCA for STEMI (10/2022)   History of Present Illness  Patient was last seen by me over a year ago.  He has been compliant with his medical therapy recently, but unfortunately, continues to smoke cigarettes and use cocaine.  He was seen in the emergency room recently with sharp chest pain.  Acute MI was excluded with EKG and serial troponins that were normal.     Vitals:   03/13/24 0931  BP: 116/86  Pulse: 80  SpO2: 96%      Review of Systems  Cardiovascular:  Positive for chest pain. Negative for dyspnea on exertion, leg swelling, palpitations and syncope.        Studies Reviewed: SABRA        EKG 03/13/2024: Normal sinus rhythm with sinus arrhythmia Right axis deviation When compared with ECG of 05-Nov-2022 06:43, Borderline criteria for Lateral infarct are no longer Present Non-specific change in ST segment in Lateral leads T wave inversion no longer evident in Inferior leads T wave inversion less evident in Lateral leads    Echocardiogram 11/04/2022: 1. Left ventricular ejection fraction, by estimation, is 55 to 60%. The  left ventricle has normal function. The left ventricle has no regional  wall motion abnormalities. There is mild left ventricular hypertrophy.  Left ventricular diastolic parameters  were normal.   2. Right ventricular systolic function is normal. The right ventricular  size is normal.   3. The mitral valve is normal in structure. Trivial mitral valve  regurgitation. No evidence of mitral stenosis.   4. The aortic valve is  tricuspid. Aortic valve regurgitation is not  visualized. No aortic stenosis is present.   5. The inferior vena cava is normal in size with greater than 50%  respiratory variability, suggesting right atrial pressure of 3 mmHg.   6. Rhythm strip during this exam demonstrates normal sinus rhythm.   Comparison(s): No prior Echocardiogram.    Coronary intervention 11/04/2022: LM: Normal LAD: Prox 30% disease Lcx: Culprit lesion flush occluded large bifurcating OM. RCA: Minimal luminal irregularities   LVEDP 21 mmHg.   Successful percutaneous coronary intervention Lcx/OM        PTCA with 3.0X12 mm balloon up to 8 atm   Residual stenosis 100%-->0% TIMI flow 0-->III  Labs 02/2023: Cr 0.9, K 3.9 Hb 15.6  10/2022: Chol 154, TG 73, HDL 37, LDL 102 Lp(a) 258 YaJ8R 4.9%   Physical Exam Vitals and nursing note reviewed.  Constitutional:      General: He is not in acute distress. Neck:     Vascular: No JVD.  Cardiovascular:     Rate and Rhythm: Normal rate and regular rhythm.     Heart sounds: Normal heart sounds. No murmur heard. Pulmonary:     Effort: Pulmonary effort is normal.     Breath sounds: Normal breath sounds. No wheezing or rales.  Musculoskeletal:     Right lower leg: No edema.     Left lower leg: No edema.      VISIT DIAGNOSES:   ICD-10-CM  1. Coronary artery disease of native artery of native heart with stable angina pectoris  I25.118 EKG 12-Lead    MYOCARDIAL PERFUSION IMAGING    ECHOCARDIOGRAM COMPLETE    Lipid panel    2. History of acute myocardial infarction  I25.2 EKG 12-Lead    3. Elevated lipoprotein(a)  E78.41 EKG 12-Lead    4. Cigarette nicotine dependence without complication  F17.210        Miguel Lucas is a 33 y.o. male with cocaine abuse, elevated Lp(a), family h/o early CAD (father had fatal MI in 42s), s/p primary PTCA for STEMI (10/2022)   Assessment & Plan  CAD: STEMI 10/2022. S/p PTCA to culprit vessel OM. Recent chest  pain, less likely to be angina.  However, given his risk factors and ongoing cocaine and tobacco abuse, recommend exercise nuclear stress test and echocardiogram.   Continue Plavix  75 mg daily, diltiazem  120 mg daily, Crestor  20 mg daily. Check lipid panel. If LDL remains >70, recommend PCSK9 inhibitor, especially given elevated lipoprotein a.  Nicotine dependence: Tobacco cessation counseling: - Currently smoking 1 pack/day   - Patient was informed of the dangers of tobacco abuse including stroke, cancer, and MI, as well as benefits of tobacco cessation. - Patient is willing to quit at this time. - Approximately 5 mins were spent counseling patient cessation techniques. We discussed various methods to help quit smoking, including deciding on a date to quit, joining a support group, pharmacological agents. Patient would like to use Wellbutrin 100 mg twice daily. - I will reassess his progress at the next follow-up visit  Cocaine abuse: Emphasized importance of complete cocaine cessation.   Informed Consent   Shared Decision Making/Informed Consent The risks [chest pain, shortness of breath, cardiac arrhythmias, dizziness, blood pressure fluctuations, myocardial infarction, stroke/transient ischemic attack, nausea, vomiting, allergic reaction, radiation exposure, metallic taste sensation and life-threatening complications (estimated to be 1 in 10,000)], benefits (risk stratification, diagnosing coronary artery disease, treatment guidance) and alternatives of a nuclear stress test were discussed in detail with Mr. Hamme and he agrees to proceed.       Meds ordered this encounter  Medications   buPROPion ER (WELLBUTRIN SR) 100 MG 12 hr tablet    Sig: Take 1 tablet (100 mg total) by mouth 2 (two) times daily.    Dispense:  180 tablet    Refill:  2     F/u in 8 weeks  Signed, Newman JINNY Lawrence, MD

## 2024-03-13 NOTE — Patient Instructions (Signed)
 Medication Instructions:  START use of Wellbutrin SR 100 mg twice a day   *If you need a refill on your cardiac medications before your next appointment, please call your pharmacy*  Lab Work: Lipid panel  If you have labs (blood work) drawn today and your tests are completely normal, you will receive your results only by: MyChart Message (if you have MyChart) OR A paper copy in the mail If you have any lab test that is abnormal or we need to change your treatment, we will call you to review the results.  Testing/Procedures: Echocardiogram within 8 weeks  Your physician has requested that you have an echocardiogram. Echocardiography is a painless test that uses sound waves to create images of your heart. It provides your doctor with information about the size and shape of your heart and how well your heart's chambers and valves are working. This procedure takes approximately one hour. There are no restrictions for this procedure. Please do NOT wear cologne, perfume, aftershave, or lotions (deodorant is allowed). Please arrive 15 minutes prior to your appointment time.  Please note: We ask at that you not bring children with you during ultrasound (echo/ vascular) testing. Due to room size and safety concerns, children are not allowed in the ultrasound rooms during exams. Our front office staff cannot provide observation of children in our lobby area while testing is being conducted. An adult accompanying a patient to their appointment will only be allowed in the ultrasound room at the discretion of the ultrasound technician under special circumstances. We apologize for any inconvenience.   Exercise Nuclear Stress Test   Your physician has requested that you have en exercise stress myoview. For further information please visit [email or web address] Please follow instruction sheet, as given.   Follow-Up: At [facility], you and your health needs are our priority.  As part of our  continuing mission to provide you with exceptional heart care, our providers are all part of one team.  This team includes your primary Cardiologist (physician) and Advanced Practice Providers or APPs (Physician Assistants and Nurse Practitioners) who all work together to provide you with the care you need, when you need it.  Your next appointment:   8 week(s)  Provider:   Newman JINNY Lawrence, MD    We recommend signing up for the patient portal called MyChart.  Sign up information is provided on this After Visit Summary.  MyChart is used to connect with patients for Virtual Visits (Telemedicine).  Patients are able to view lab/test results, encounter notes, upcoming appointments, etc.  Non-urgent messages can be sent to your provider as well.   To learn more about what you can do with MyChart, go to [email or web address].

## 2024-03-14 ENCOUNTER — Telehealth: Payer: Self-pay

## 2024-03-14 DIAGNOSIS — Z7689 Persons encountering health services in other specified circumstances: Secondary | ICD-10-CM

## 2024-03-14 NOTE — Telephone Encounter (Signed)
 Per verbal from Dr. Elmira referral for a PCP was placed.

## 2024-03-22 ENCOUNTER — Other Ambulatory Visit: Payer: Self-pay | Admitting: Cardiology

## 2024-03-22 ENCOUNTER — Telehealth (HOSPITAL_COMMUNITY): Payer: Self-pay | Admitting: *Deleted

## 2024-03-22 DIAGNOSIS — I25118 Atherosclerotic heart disease of native coronary artery with other forms of angina pectoris: Secondary | ICD-10-CM

## 2024-03-22 NOTE — Telephone Encounter (Signed)
 Patient given detailed instructions per Myocardial Perfusion Study Information Sheet for the test on 03/27/2024 at 7:45. Patient notified to arrive 15 minutes early and that it is imperative to arrive on time for appointment to keep from having the test rescheduled.  If you need to cancel or reschedule your appointment, please call the office within 24 hours of your appointment. . Patient verbalized understanding.Miguel Lucas

## 2024-03-26 ENCOUNTER — Ambulatory Visit (HOSPITAL_COMMUNITY)
Admission: RE | Admit: 2024-03-26 | Discharge: 2024-03-26 | Disposition: A | Discharge status: Home / Self Care | Payer: MEDICAID | Source: Ambulatory Visit | Attending: Cardiovascular Disease | Admitting: Cardiovascular Disease

## 2024-03-26 DIAGNOSIS — I25118 Atherosclerotic heart disease of native coronary artery with other forms of angina pectoris: Secondary | ICD-10-CM

## 2024-04-10 ENCOUNTER — Ambulatory Visit: Admitting: Cardiology

## 2024-04-10 ENCOUNTER — Telehealth (HOSPITAL_COMMUNITY): Payer: Self-pay | Admitting: *Deleted

## 2024-04-10 NOTE — Telephone Encounter (Signed)
 Could not reach patient to go over instructions for stress test on 04/15/24.  Could not leave a generic message for him to call us  back either.

## 2024-04-15 ENCOUNTER — Ambulatory Visit (HOSPITAL_COMMUNITY): Payer: MEDICAID | Attending: Cardiology

## 2024-04-15 ENCOUNTER — Encounter (HOSPITAL_COMMUNITY): Payer: Self-pay

## 2024-04-15 ENCOUNTER — Telehealth (HOSPITAL_COMMUNITY): Payer: Self-pay | Admitting: Cardiology

## 2024-04-15 ENCOUNTER — Ambulatory Visit (HOSPITAL_COMMUNITY): Payer: MEDICAID | Attending: Cardiovascular Disease

## 2024-04-15 NOTE — Telephone Encounter (Signed)
 Patient NO SHOWED Myoview and scheduled echo for 04/15/24. We will not reach out to reschedule patient due to 34% NO SHOW RATE. Orders will be removed from the echo/nuc WQ.

## 2024-04-23 ENCOUNTER — Encounter: Payer: Self-pay | Admitting: Cardiology

## 2024-05-03 ENCOUNTER — Ambulatory Visit: Payer: MEDICAID | Attending: Cardiology | Admitting: Cardiology

## 2024-05-03 ENCOUNTER — Other Ambulatory Visit: Payer: Self-pay | Admitting: Cardiology

## 2024-05-03 DIAGNOSIS — I2510 Atherosclerotic heart disease of native coronary artery without angina pectoris: Secondary | ICD-10-CM

## 2024-05-06 ENCOUNTER — Encounter: Payer: Self-pay | Admitting: Cardiology

## 2024-06-05 ENCOUNTER — Ambulatory Visit (HOSPITAL_BASED_OUTPATIENT_CLINIC_OR_DEPARTMENT_OTHER): Payer: MEDICAID | Admitting: Family Medicine
